# Patient Record
Sex: Female | Born: 1985 | Race: Black or African American | Hispanic: No | Marital: Single | Smoking: Current every day smoker
Health system: Southern US, Community
[De-identification: ages and names within clinical notes are randomized; demographics above are authoritative.]

## PROBLEM LIST (undated history)

## (undated) DIAGNOSIS — F419 Anxiety disorder, unspecified: Secondary | ICD-10-CM

## (undated) DIAGNOSIS — F329 Major depressive disorder, single episode, unspecified: Secondary | ICD-10-CM

## (undated) DIAGNOSIS — F32A Depression, unspecified: Secondary | ICD-10-CM

## (undated) DIAGNOSIS — I10 Essential (primary) hypertension: Secondary | ICD-10-CM

---
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
## 2006-08-21 IMAGING — US US TRANSVAGINAL NON-OB
1 series · 14 of 25 positions shown · non-contrast
Comparison: none

DUPLICATE COPY for exam association in [facility] - No change from original report (09/07/04).
CLINICAL DATA: Irregular menstrual bleeding. 
 TRANSABDOMINAL AND TRANSVAGINAL PELVIC ULTRASOUND: 
 No comparison.  The uterus is sonographically normal measuring 5.6cm long x 2cm AP x 3cm wide with 6mm fundal endometrial stripe thickness.  Slight cul-de-sac free fluid may represent ovulation or leaking/ruptured ovarian cyst currently not seen.  The bilateral ovaries are upper limits of normal in size with the right measuring 3.8cm long x 2.6cm AP x 2.8cm wide and the left 3.8cm long x 2.6cm AP x 2.8cm wide.  Peripheral sub-cm follicles are seen at the bilateral ovaries which could represent polycystic ovary disease ? need clinical correlation.

[Series 1: unknown · 0.16mm/px · 14 of 65 slices shown]
[im 1/65]
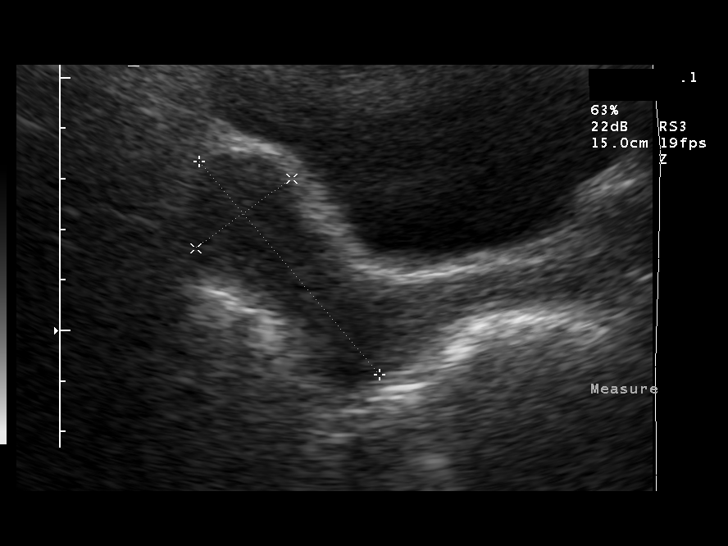
[im 6/65]
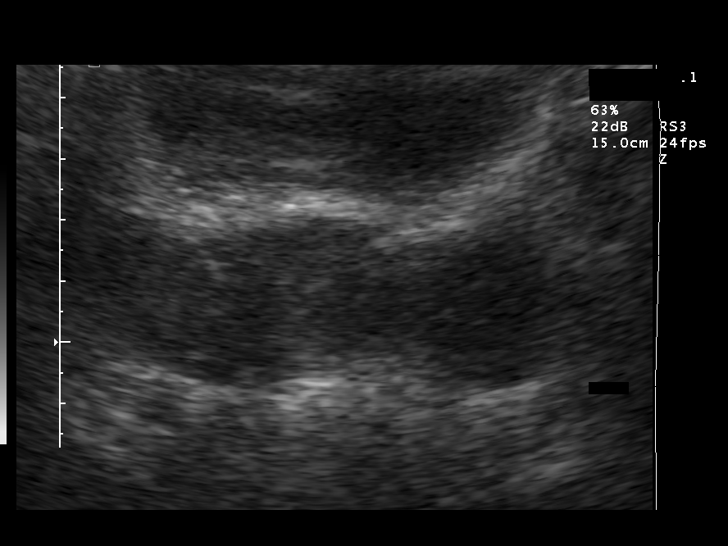
[im 11/65]
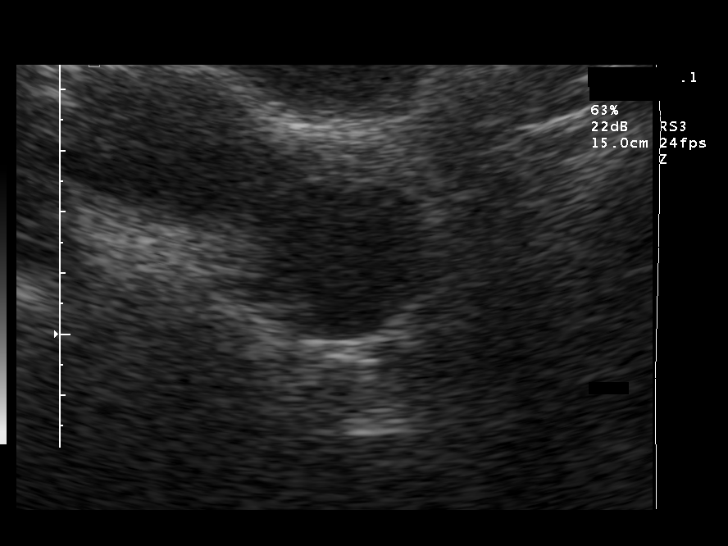
[im 17/65]
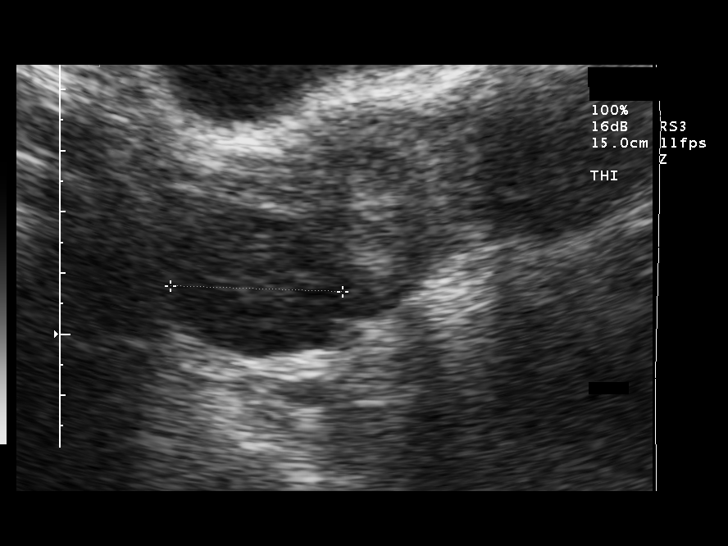
[im 22/65]
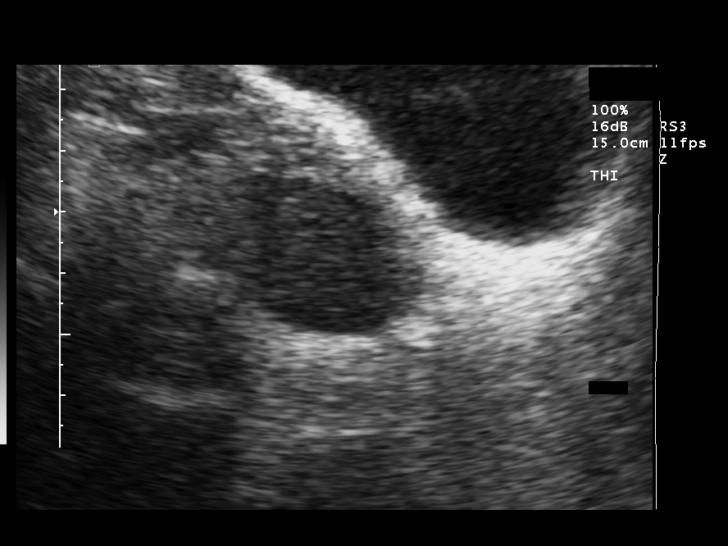
[im 25/65]
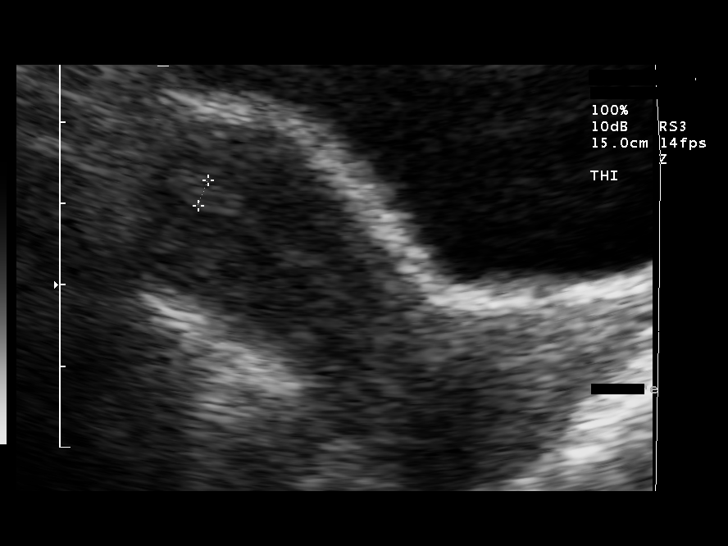
[im 30/65]
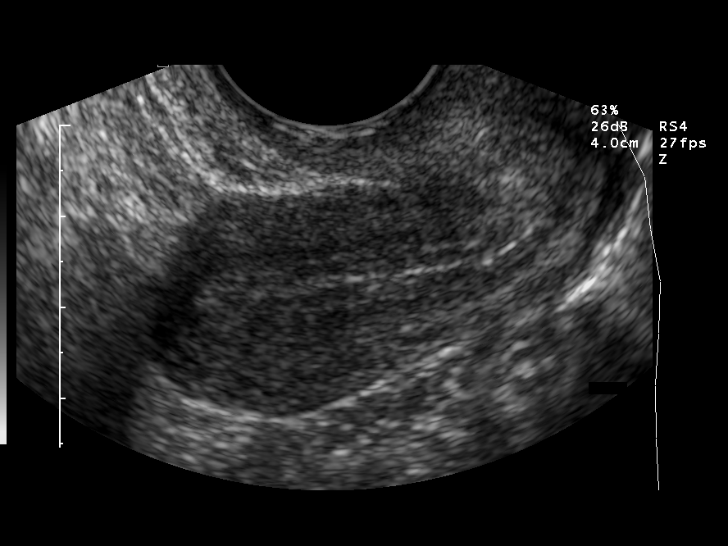
[im 35/65]
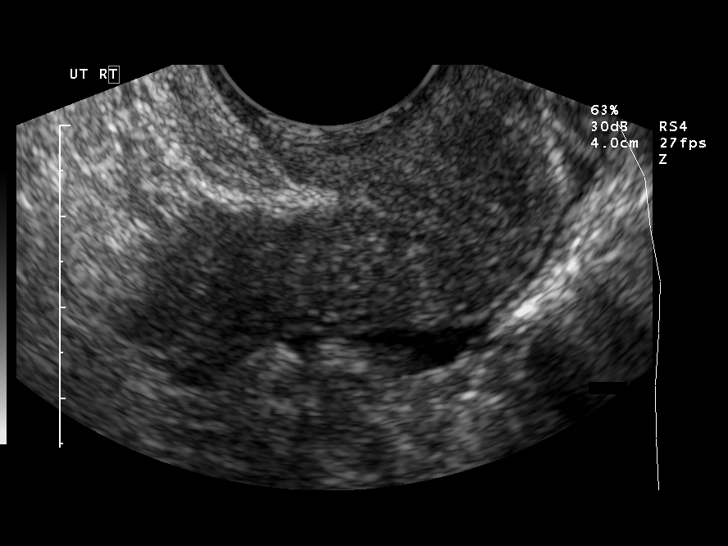
[im 41/65]
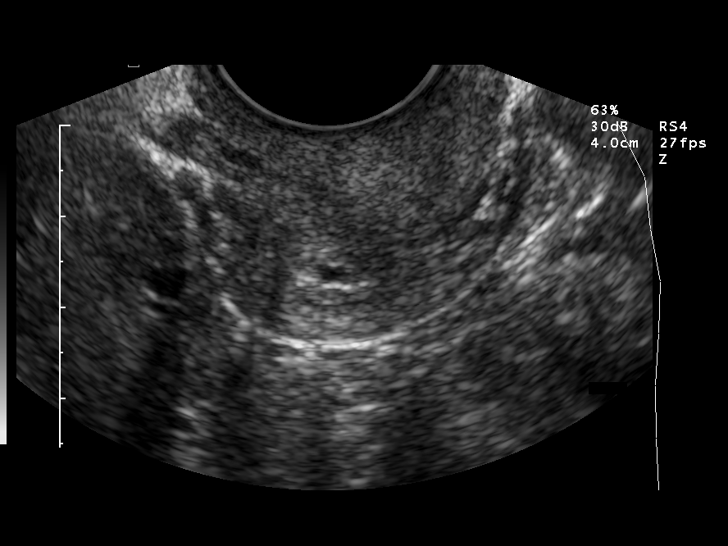
[im 43/65]
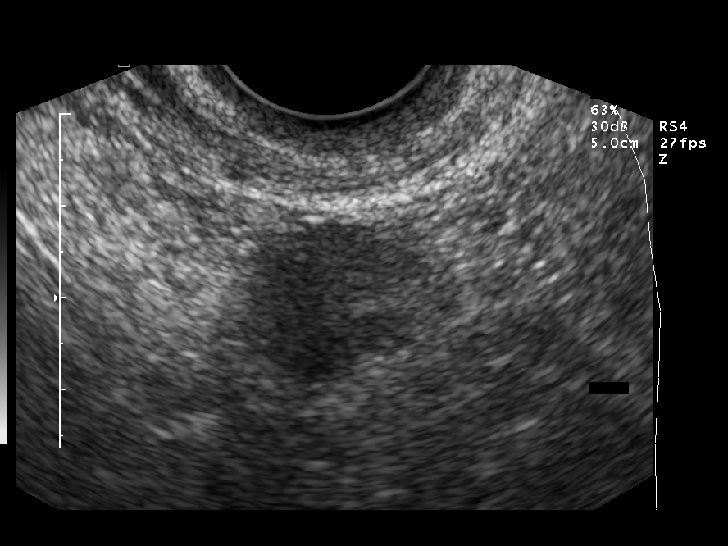
[im 49/65]
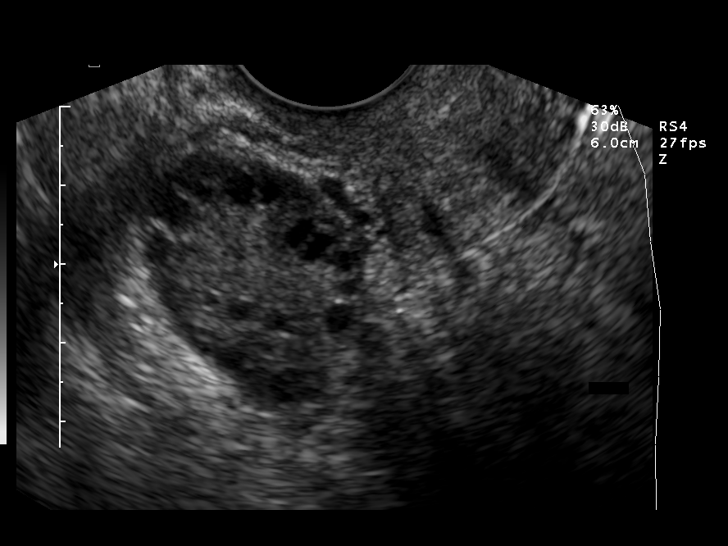
[im 54/65]
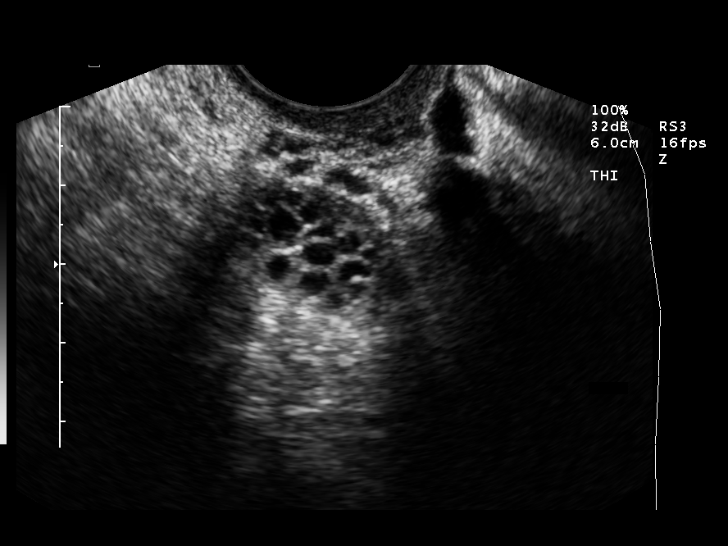
[im 59/65]
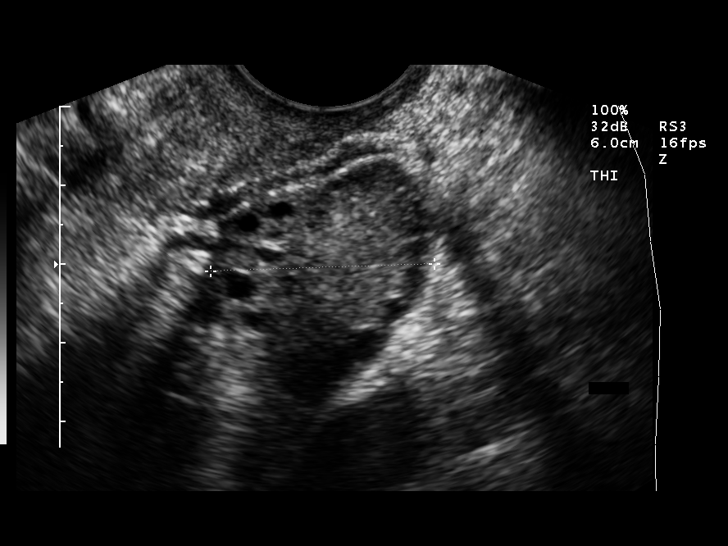
[im 65/65]
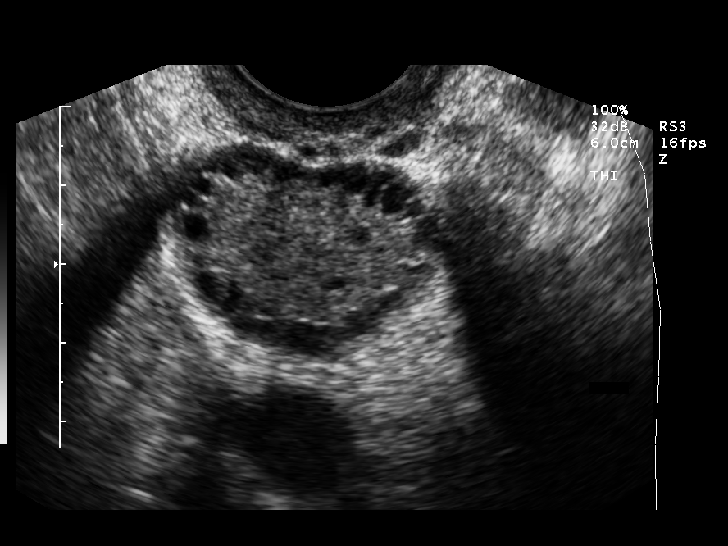

[14 of 25 positions shown; findings below may reference images not displayed]

IMPRESSION: 1.  Possible polycystic ovary disease. 
 2.  Slight cul-de-sac free fluid. 
 3.  Otherwise normal.

## 2014-09-13 ENCOUNTER — Emergency Department (HOSPITAL_COMMUNITY)
Admission: EM | Admit: 2014-09-13 | Discharge: 2014-09-13 | Disposition: A | Discharge status: Home / Self Care | Payer: Self-pay | Attending: Emergency Medicine | Admitting: Emergency Medicine

## 2014-09-13 ENCOUNTER — Encounter (HOSPITAL_COMMUNITY): Payer: Self-pay | Admitting: *Deleted

## 2014-09-13 DIAGNOSIS — F419 Anxiety disorder, unspecified: Secondary | ICD-10-CM | POA: Insufficient documentation

## 2014-09-13 DIAGNOSIS — R221 Localized swelling, mass and lump, neck: Secondary | ICD-10-CM | POA: Insufficient documentation

## 2014-09-13 DIAGNOSIS — K029 Dental caries, unspecified: Secondary | ICD-10-CM | POA: Insufficient documentation

## 2014-09-13 DIAGNOSIS — Z720 Tobacco use: Secondary | ICD-10-CM | POA: Insufficient documentation

## 2014-09-13 DIAGNOSIS — K088 Other specified disorders of teeth and supporting structures: Secondary | ICD-10-CM | POA: Insufficient documentation

## 2014-09-13 HISTORY — DX: Anxiety disorder, unspecified: F41.9

## 2014-09-13 HISTORY — DX: Essential (primary) hypertension: I10

## 2014-09-13 MED ORDER — NAPROXEN 500 MG PO TABS: 500.0000 mg | ORAL_TABLET | Freq: Two times a day (BID) | ORAL | Status: DC | PRN

## 2014-09-13 MED ORDER — HYDROCODONE-ACETAMINOPHEN 5-325 MG PO TABS: 1.0000 | ORAL_TABLET | Freq: Four times a day (QID) | ORAL | Status: DC | PRN

## 2014-09-13 MED ORDER — HYDROCODONE-ACETAMINOPHEN 5-325 MG PO TABS
1.0000 | ORAL_TABLET | Freq: Once | ORAL | Status: AC
  Administered 2014-09-13: 1 via ORAL
  Filled 2014-09-13: qty 1

## 2014-09-13 NOTE — Discharge Instructions (Signed)
Apply warm compresses to jaw throughout the day. Take naprosyn and norco as directed, as needed for pain but do not drive or operate machinery with pain medication use. Followup with a dentist is very important for ongoing evaluation and management of recurrent dental pain. Call the dentist listed above in the next 48 hours, or use the list below to find a dentist. STOP SMOKING. Return to emergency department for emergent changing or worsening symptoms.   Dental Caries Dental caries (also called tooth decay) is the most common oral disease. It can occur at any age but is more common in children and young adults.  HOW DENTAL CARIES DEVELOPS  The process of decay begins when bacteria and foods (particularly sugars and starches) combine in your mouth to produce plaque. Plaque is a substance that sticks to the hard, outer surface of a tooth (enamel). The bacteria in plaque produce acids that attack enamel. These acids may also attack the root surface of a tooth (cementum) if it is exposed. Repeated attacks dissolve these surfaces and create holes in the tooth (cavities). If left untreated, the acids destroy the other layers of the tooth.  RISK FACTORS  Frequent sipping of sugary beverages.   Frequent snacking on sugary and starchy foods, especially those that easily get stuck in the teeth.   Poor oral hygiene.   Dry mouth.   Substance abuse such as methamphetamine abuse.   Broken or poor-fitting dental restorations.   Eating disorders.   Gastroesophageal reflux disease (GERD).   Certain radiation treatments to the head and neck. SYMPTOMS In the early stages of dental caries, symptoms are seldom present. Sometimes white, chalky areas may be seen on the enamel or other tooth layers. In later stages, symptoms may include:  Pits and holes on the enamel.  Toothache after sweet, hot, or cold foods or drinks are consumed.  Pain around the tooth.  Swelling around the tooth. DIAGNOSIS    Most of the time, dental caries is detected during a regular dental checkup. A diagnosis is made after a thorough medical and dental history is taken and the surfaces of your teeth are checked for signs of dental caries. Sometimes special instruments, such as lasers, are used to check for dental caries. Dental X-ray exams may be taken so that areas not visible to the eye (such as between the contact areas of the teeth) can be checked for cavities.  TREATMENT  If dental caries is in its early stages, it may be reversed with a fluoride treatment or an application of a remineralizing agent at the dental office. Thorough brushing and flossing at home is needed to aid these treatments. If it is in its later stages, treatment depends on the location and extent of tooth destruction:   If a small area of the tooth has been destroyed, the destroyed area will be removed and cavities will be filled with a material such as gold, silver amalgam, or composite resin.   If a large area of the tooth has been destroyed, the destroyed area will be removed and a cap (crown) will be fitted over the remaining tooth structure.   If the center part of the tooth (pulp) is affected, a procedure called a root canal will be needed before a filling or crown can be placed.   If most of the tooth has been destroyed, the tooth may need to be pulled (extracted). HOME CARE INSTRUCTIONS You can prevent, stop, or reverse dental caries at home by practicing good oral  hygiene. Good oral hygiene includes:  Thoroughly cleaning your teeth at least twice a day with a toothbrush and dental floss.   Using a fluoride toothpaste. A fluoride mouth rinse may also be used if recommended by your dentist or health care provider.   Restricting the amount of sugary and starchy foods and sugary liquids you consume.   Avoiding frequent snacking on these foods and sipping of these liquids.   Keeping regular visits with a dentist for  checkups and cleanings. PREVENTION   Practice good oral hygiene.  Consider a dental sealant. A dental sealant is a coating material that is applied by your dentist to the pits and grooves of teeth. The sealant prevents food from being trapped in them. It may protect the teeth for several years.  Ask about fluoride supplements if you live in a community without fluorinated water or with water that has a low fluoride content. Use fluoride supplements as directed by your dentist or health care provider.  Allow fluoride varnish applications to teeth if directed by your dentist or health care provider. Document Released: 11/20/2001 Document Revised: 07/15/2013 Document Reviewed: 03/02/2012 [facility] Patient Information 2015 [place], [place]. This information is not intended to replace advice given to you by your health care provider. Make sure you discuss any questions you have with your health care provider.  Dental Pain Toothache is pain in or around a tooth. It may get worse with chewing or with cold or heat.  HOME CARE  Your dentist may use a numbing medicine during treatment. If so, you may need to avoid eating until the medicine wears off. Ask your dentist about this.  Only take medicine as told by your dentist or doctor.  Avoid chewing food near the painful tooth until after all treatment is done. Ask your dentist about this. GET HELP RIGHT AWAY IF:   The problem gets worse or new problems appear.  You have a fever.  There is redness and puffiness (swelling) of the face, jaw, or neck.  You cannot open your mouth.  There is pain in the jaw.  There is very bad pain that is not helped by medicine. MAKE SURE YOU:   Understand these instructions.  Will watch your condition.  Will get help right away if you are not doing well or get worse. Document Released: 08/17/2007 Document Revised: 05/23/2011 Document Reviewed: 08/17/2007 [facility] Patient Information 2015 [place], [place].  This information is not intended to replace advice given to you by your health care provider. Make sure you discuss any questions you have with your health care provider.  Smoking Cessation, Tips for Success If you are ready to quit smoking, congratulations! You have chosen to help yourself be healthier. Cigarettes bring nicotine, tar, carbon monoxide, and other irritants into your body. Your lungs, heart, and blood vessels will be able to work better without these poisons. There are many different ways to quit smoking. Nicotine gum, nicotine patches, a nicotine inhaler, or nicotine nasal spray can help with physical craving. Hypnosis, support groups, and medicines help break the habit of smoking. WHAT THINGS CAN I DO TO MAKE QUITTING EASIER?  Here are some tips to help you quit for good:  Pick a date when you will quit smoking completely. Tell all of your friends and family about your plan to quit on that date.  Do not try to slowly cut down on the number of cigarettes you are smoking. Pick a quit date and quit smoking completely starting on that day.  Throw away all cigarettes.   Clean and remove all ashtrays from your home, work, and car.  On a card, write down your reasons for quitting. Carry the card with you and read it when you get the urge to smoke.  Cleanse your body of nicotine. Drink enough water and fluids to keep your urine clear or pale yellow. Do this after quitting to flush the nicotine from your body.  Learn to predict your moods. Do not let a bad situation be your excuse to have a cigarette. Some situations in your life might tempt you into wanting a cigarette.  Never have "just one" cigarette. It leads to wanting another and another. Remind yourself of your decision to quit.  Change habits associated with smoking. If you smoked while driving or when feeling stressed, try other activities to replace smoking. Stand up when drinking your coffee. Brush your teeth after eating.  Sit in a different chair when you read the paper. Avoid alcohol while trying to quit, and try to drink fewer caffeinated beverages. Alcohol and caffeine may urge you to smoke.  Avoid foods and drinks that can trigger a desire to smoke, such as sugary or spicy foods and alcohol.  Ask people who smoke not to smoke around you.  Have something planned to do right after eating or having a cup of coffee. For example, plan to take a walk or exercise.  Try a relaxation exercise to calm you down and decrease your stress. Remember, you may be tense and nervous for the first 2 weeks after you quit, but this will pass.  Find new activities to keep your hands busy. Play with a pen, coin, or rubber band. Doodle or draw things on paper.  Brush your teeth right after eating. This will help cut down on the craving for the taste of tobacco after meals. You can also try mouthwash.   Use oral substitutes in place of cigarettes. Try using lemon drops, carrots, cinnamon sticks, or chewing gum. Keep them handy so they are available when you have the urge to smoke.  When you have the urge to smoke, try deep breathing.  Designate your home as a nonsmoking area.  If you are a heavy smoker, ask your health care provider about a prescription for nicotine chewing gum. It can ease your withdrawal from nicotine.  Reward yourself. Set aside the cigarette money you save and buy yourself something nice.  Look for support from others. Join a support group or smoking cessation program. Ask someone at home or at work to help you with your plan to quit smoking.  Always ask yourself, "Do I need this cigarette or is this just a reflex?" Tell yourself, "Today, I choose not to smoke," or "I do not want to smoke." You are reminding yourself of your decision to quit.  Do not replace cigarette smoking with electronic cigarettes (commonly called e-cigarettes). The safety of e-cigarettes is unknown, and some may contain harmful  chemicals.  If you relapse, do not give up! Plan ahead and think about what you will do the next time you get the urge to smoke. HOW WILL I FEEL WHEN I QUIT SMOKING? You may have symptoms of withdrawal because your body is used to nicotine (the addictive substance in cigarettes). You may crave cigarettes, be irritable, feel very hungry, cough often, get headaches, or have difficulty concentrating. The withdrawal symptoms are only temporary. They are strongest when you first quit but will go away within 10-14 days. When  withdrawal symptoms occur, stay in control. Think about your reasons for quitting. Remind yourself that these are signs that your body is healing and getting used to being without cigarettes. Remember that withdrawal symptoms are easier to treat than the major diseases that smoking can cause.  Even after the withdrawal is over, expect periodic urges to smoke. However, these cravings are generally short lived and will go away whether you smoke or not. Do not smoke! WHAT RESOURCES ARE AVAILABLE TO HELP ME QUIT SMOKING? Your health care provider can direct you to community resources or hospitals for support, which may include:  Group support.  Education.  Hypnosis.  Therapy. Document Released: 11/27/2003 Document Revised: 07/15/2013 Document Reviewed: 08/16/2012 [facility] Patient Information 2015 [place], [place]. This information is not intended to replace advice given to you by your health care provider. Make sure you discuss any questions you have with your health care provider.   Emergency Department Resource Guide 1) Find a Doctor and Pay Out of Pocket Although you won't have to find out who is covered by your insurance plan, it is a good idea to ask around and get recommendations. You will then need to call the office and see if the doctor you have chosen will accept you as a new patient and what types of options they offer for patients who are self-pay. Some doctors offer  discounts or will set up payment plans for their patients who do not have insurance, but you will need to ask so you aren't surprised when you get to your appointment.  2) Contact Your Local Health Department Not all health departments have doctors that can see patients for sick visits, but many do, so it is worth a call to see if yours does. If you don't know where your local health department is, you can check in your phone book. The CDC also has a tool to help you locate your state's health department, and many state websites also have listings of all of their local health departments.  3) Find a Walk-in Clinic If your illness is not likely to be very severe or complicated, you may want to try a walk in clinic. These are popping up all over the country in pharmacies, drugstores, and shopping centers. They're usually staffed by nurse practitioners or physician assistants that have been trained to treat common illnesses and complaints. They're usually fairly quick and inexpensive. However, if you have serious medical issues or chronic medical problems, these are probably not your best option.  No Primary Care Doctor: - Call Health Connect at  [phone] - they can help you locate a primary care doctor that  accepts your insurance, provides certain services, etc. - Physician Referral Service- [phone]  Chronic Pain Problems: Organization         Address  Phone   Notes  [facility]  [phone] Patients need to be referred by their primary care doctor.   Medication Assistance: Organization         Address  Phone   Notes  [facility] Medication [facility] [address] [place], [place] [address] [phone] --Must be a resident of [facility] -- Must have NO insurance coverage whatsoever (no Medicaid/ Medicare, etc.) -- The pt. MUST have a primary care doctor that directs their care regularly and follows them in the community   [facility]   [phone]   [facility]  [phone]     Dental Care: Organization  Address  Phone  Notes  [facility] Department of [facility] [address] [place], [place] [phone] Accepts children up to age 67 who are enrolled in [place] or [place] Health Choice; pregnant women with a Medicaid card; and children who have applied for Medicaid or [place] Health Choice, but were declined, whose parents can pay a reduced fee at time of service.  [facility] Department of [facility]  [address], [place] [phone] Accepts children up to age 24 who are enrolled in [place] or [place] Health Choice; pregnant women with a Medicaid card; and children who have applied for Medicaid or [place] Health Choice, but were declined, whose parents can pay a reduced fee at time of service.  [facility]  [address] [place], [place] [phone] Patients are seen by appointment only. Walk-ins are not accepted. [facility] will see patients 75 years of age and older. Monday - Tuesday (8am-5pm) Most Wednesdays (8:30-5pm) $30 per visit, cash only  [facility]  [address], [facility] [phone] Patients are seen by appointment only. Walk-ins are not accepted. [facility] will see patients 34 years of age and older. One Wednesday Evening (Monthly: Volunteer Based).  $30 per visit, cash only  [employer] [facility] [employer]  [phone] for adults; Children under age 79, call Graduate Pediatric Dentistry at [phone]. Children aged 71-14, please call [phone] to request a pediatric application.  Dental services are provided in all areas of dental care including fillings, crowns and bridges, complete and partial dentures, implants, gum treatment, root canals, and extractions. Preventive care is also provided. Treatment is provided to both adults and children. Patients are selected via  a lottery and there is often a waiting list.   [facility] [address] [place]  [phone] [email or web address]   [facility] [address] [place], [place] [phone], Ext. [phone] Second and Fourth Thursday of each month, opens at 6:30 AM; Clinic ends at 9 AM.  Patients are seen on a first-come first-served basis, and a limited number are seen during each clinic.   [facility]  [address] [place], [place] [phone]   Eligibility Requirements You must have lived in [place], [place], or [place] counties for at least the last three months.   You cannot be eligible for state or federal sponsored [place], including [employer], [place], or [employer].   You generally cannot be eligible for healthcare insurance through your employer.    How to apply: Eligibility screenings are held every Tuesday and Wednesday afternoon from 1:00 pm until 4:00 pm. You do not need an appointment for the interview!  [facility] [address] [place], [place] [phone]   [facility]  [phone]   [facility]  [phone]   [facility] Health Department  [phone]

## 2014-09-13 NOTE — ED Notes (Signed)
Pt reports ongoint dental pain and pain increased  Today pain is 9/10

## 2014-09-13 NOTE — ED Provider Notes (Signed)
CSN: [id number]     Arrival date & time 09/13/14  0918 History  This chart was scribed for [employer], PA-C, working with Vanetta Mulders, MD by Chestine Spore, ED Scribe. The patient was seen in room TR08C/TR08C at 9:34 AM.    Chief Complaint  Patient presents with  . Dental Problem      Patient is a 29 y.o. female presenting with tooth pain. The history is provided by the patient. No language interpreter was used.  Dental Pain Location:  Lower Lower teeth location:  17/LL 3rd molar Quality:  Throbbing Severity:  Moderate Onset quality:  Gradual Duration:  2 months Timing:  Constant Progression:  Worsening Chronicity:  Recurrent Context: dental caries and poor dentition   Previous work-up:  Dental exam Relieved by:  Nothing Exacerbated by: air. Ineffective treatments:  NSAIDs Associated symptoms: neck swelling (subjective)   Associated symptoms: no difficulty swallowing, no drooling, no facial swelling, no fever, no gum swelling, no neck pain, no oral bleeding, no oral lesions and no trismus   Risk factors: lack of dental care and smoking   Risk factors: no diabetes     BUNNIE REHBERG is a 29 y.o. female with no significant PMHx who presents to the Emergency department complaining of left lower back dental pain onset 2 months worsening 1 week ago. Pt pain is 9/10, constant, throbbing, and it radiates down left side of neck. She states that air worsens her pain. She reports that she has tried aleve with no relief for her symptoms. Pt has just moved to the area and is in the process of acquiring insurance. She reports that when she last had dental work completed, her dentist reported that she needs to have a root canal. She states that she is having associated symptoms of subjective left neck swelling. She denies gum swelling/bleeding/drainage, fever, chills, neck stiffness, trismus, drooling, sore throat, rhinorrhea, CP, abdominal pain, n/v, numbness, tingling, weakness,  and any other symptoms. Pt denies being allergic to any medications. Pt reports that she is a current smoker. Pt denies being allergic to any medications.     No past medical history on file. No past surgical history on file. No family history on file. History  Substance Use Topics  . Smoking status: Not on file  . Smokeless tobacco: Not on file  . Alcohol Use: Not on file   OB History    No data available     Review of Systems  Constitutional: Negative for fever and chills.  HENT: Positive for dental problem. Negative for drooling, facial swelling, mouth sores, rhinorrhea, sore throat and trouble swallowing.   Respiratory: Negative for cough and shortness of breath.   Cardiovascular: Negative for chest pain.  Gastrointestinal: Negative for nausea, vomiting and abdominal pain.  Musculoskeletal: Negative for neck pain and neck stiffness.  Skin: Negative for color change.  Neurological: Negative for weakness and numbness.    A complete 10 system review of systems was obtained and all systems are negative except as noted in the HPI and PMH.    Allergies  Review of patient's allergies indicates not on file.  Home Medications   Prior to Admission medications   Medication Sig Start Date End Date Taking? Authorizing Provider  HYDROcodone-acetaminophen (NORCO) 5-325 MG per tablet Take 1 tablet by mouth every 6 (six) hours as needed for severe pain. 09/13/14   Axie Hayne Camprubi-Soms, PA-C  naproxen (NAPROSYN) 500 MG tablet Take 1 tablet (500 mg total) by mouth 2 (two) times daily  as needed for mild pain, moderate pain or headache (TAKE WITH MEALS.). 09/13/14   Desmen Schoffstall Camprubi-Soms, PA-C   BP 153/107 mmHg  Pulse 81  Temp(Src) 97.8 F (36.6 C)  Resp 16  Ht 5\' 7"  (1.702 m)  Wt 242 lb (109.77 kg)  BMI 37.89 kg/m2  SpO2 99% Physical Exam  Constitutional: She is oriented to person, place, and time. Vital signs are normal. She appears well-developed and well-nourished.  Non-toxic  appearance. No distress.  Afebrile, nontoxic, NAD  HENT:  Head: Normocephalic and atraumatic.  Nose: Nose normal.  Mouth/Throat: Uvula is midline, oropharynx is clear and moist and mucous membranes are normal. No trismus in the jaw. Dental caries present. No dental abscesses or uvula swelling.  Left lower molar number 17 decayed without surrounding gingival erythema or swelling, no evidence of abscess. Nose clear. Oropharynx clear and moist, without uvular swelling or deviation, no trismus or drooling, no tonsillar swelling or erythema, no exudates.   Eyes: Conjunctivae and EOM are normal. Right eye exhibits no discharge. Left eye exhibits no discharge.  Neck: Normal range of motion. Neck supple.  Cardiovascular: Normal rate.   Pulmonary/Chest: Effort normal. No respiratory distress.  Abdominal: Normal appearance. She exhibits no distension.  Musculoskeletal: Normal range of motion.  Neurological: She is alert and oriented to person, place, and time. She has normal strength. No sensory deficit.  Skin: Skin is warm, dry and intact. No rash noted.  Psychiatric: She has a normal mood and affect. Her behavior is normal.  Nursing note and vitals reviewed.   ED Course  Procedures (including critical care time) COORDINATION OF CARE: 9:41 AM-Discussed treatment plan which includes referral to dentist, warm compress, and norco with pt at bedside and pt agreed to plan.   Labs Review Labs Reviewed - No data to display  Imaging Review No results found.   EKG Interpretation None      MDM   Final diagnoses:  Dental caries  Pain due to dental caries  Tobacco abuse    29 y.o. female here with Dental pain associated with dental caries without signs of infection, ongoing for months, with patient afebrile, non toxic appearing and swallowing secretions well. I gave patient referral to dentist and stressed the importance of dental follow up for ultimate management of dental pain.  I have also  discussed reasons to return immediately to the ER.  Patient expresses understanding and agrees with plan.  I will also give pain control.  Doubt need for abx. Smoking cessation discussed.  I personally performed the services described in this documentation, which was scribed in my presence. The recorded information has been reviewed and is accurate.  BP 153/107 mmHg  Pulse 81  Temp(Src) 97.8 F (36.6 C)  Resp 16  Ht 5\' 7"  (1.702 m)  Wt 242 lb (109.77 kg)  BMI 37.89 kg/m2  SpO2 99%  LMP 08/14/2014  Meds ordered this encounter  Medications  . HYDROcodone-acetaminophen (NORCO/VICODIN) 5-325 MG per tablet 1 tablet    Sig:   . HYDROcodone-acetaminophen (NORCO) 5-325 MG per tablet    Sig: Take 1 tablet by mouth every 6 (six) hours as needed for severe pain.    Dispense:  6 tablet    Refill:  0    Order Specific Question:  Supervising Provider    Answer:  MILLER, BRIAN [[id number]]  . naproxen (NAPROSYN) 500 MG tablet    Sig: Take 1 tablet (500 mg total) by mouth 2 (two) times daily as needed for mild pain,  moderate pain or headache (TAKE WITH MEALS.).    Dispense:  20 tablet    Refill:  0    Order Specific Question:  Supervising Provider    Answer:  Eber HongMILLER, BRIAN [[id number]]     Deborh Pense Camprubi-Soms, PA-C 09/13/14 [phone]  Vanetta MuldersScott Zackowski, MD 09/18/14 [phone]

## 2014-09-13 NOTE — ED Notes (Signed)
Declined W/C at D/C and was escorted to lobby by RN. 

## 2014-09-18 ENCOUNTER — Emergency Department (HOSPITAL_COMMUNITY): Payer: Self-pay

## 2014-09-18 ENCOUNTER — Encounter (HOSPITAL_COMMUNITY): Payer: Self-pay | Admitting: Emergency Medicine

## 2014-09-18 ENCOUNTER — Emergency Department (HOSPITAL_COMMUNITY)
Admission: EM | Admit: 2014-09-18 | Discharge: 2014-09-18 | Disposition: A | Discharge status: Home / Self Care | Payer: Self-pay | Attending: Emergency Medicine | Admitting: Emergency Medicine

## 2014-09-18 DIAGNOSIS — F329 Major depressive disorder, single episode, unspecified: Secondary | ICD-10-CM | POA: Insufficient documentation

## 2014-09-18 DIAGNOSIS — I10 Essential (primary) hypertension: Secondary | ICD-10-CM | POA: Insufficient documentation

## 2014-09-18 DIAGNOSIS — R109 Unspecified abdominal pain: Secondary | ICD-10-CM

## 2014-09-18 DIAGNOSIS — Z720 Tobacco use: Secondary | ICD-10-CM | POA: Insufficient documentation

## 2014-09-18 DIAGNOSIS — F419 Anxiety disorder, unspecified: Secondary | ICD-10-CM | POA: Insufficient documentation

## 2014-09-18 DIAGNOSIS — F32A Depression, unspecified: Secondary | ICD-10-CM

## 2014-09-18 DIAGNOSIS — Z79899 Other long term (current) drug therapy: Secondary | ICD-10-CM | POA: Insufficient documentation

## 2014-09-18 DIAGNOSIS — R1011 Right upper quadrant pain: Secondary | ICD-10-CM | POA: Insufficient documentation

## 2014-09-18 DIAGNOSIS — R1013 Epigastric pain: Secondary | ICD-10-CM | POA: Insufficient documentation

## 2014-09-18 DIAGNOSIS — R110 Nausea: Secondary | ICD-10-CM | POA: Insufficient documentation

## 2014-09-18 DIAGNOSIS — Z3202 Encounter for pregnancy test, result negative: Secondary | ICD-10-CM | POA: Insufficient documentation

## 2014-09-18 DIAGNOSIS — Z8719 Personal history of other diseases of the digestive system: Secondary | ICD-10-CM | POA: Insufficient documentation

## 2014-09-18 HISTORY — DX: Major depressive disorder, single episode, unspecified: F32.9

## 2014-09-18 HISTORY — DX: Depression, unspecified: F32.A

## 2014-09-18 LAB — COMPREHENSIVE METABOLIC PANEL
ALT: 21 U/L (ref 14–54)
ANION GAP: 6 (ref 5–15)
AST: 23 U/L (ref 15–41)
Albumin: 4 g/dL (ref 3.5–5.0)
Alkaline Phosphatase: 55 U/L (ref 38–126)
BUN: 10 mg/dL (ref 6–20)
CALCIUM: 9.2 mg/dL (ref 8.9–10.3)
CO2: 28 mmol/L (ref 22–32)
CREATININE: 0.79 mg/dL (ref 0.44–1.00)
Chloride: 105 mmol/L (ref 101–111)
GFR calc non Af Amer: >60 mL/min (ref >60)
GLUCOSE: 121 mg/dL — AB (ref 65–99)
Potassium: 3.8 mmol/L (ref 3.5–5.1)
SODIUM: 139 mmol/L (ref 135–145)
TOTAL PROTEIN: 7.4 g/dL (ref 6.5–8.1)
Total Bilirubin: 0.7 mg/dL (ref 0.3–1.2)

## 2014-09-18 LAB — URINALYSIS, ROUTINE W REFLEX MICROSCOPIC
Glucose, UA: NEGATIVE mg/dL
KETONES UR: 15 mg/dL — AB
LEUKOCYTES UA: NEGATIVE
NITRITE: NEGATIVE
Protein, ur: NEGATIVE mg/dL
Specific Gravity, Urine: 1.034 — ABNORMAL HIGH (ref 1.005–1.030)
Urobilinogen, UA: 0.2 mg/dL (ref 0.0–1.0)
pH: 5.5 (ref 5.0–8.0)

## 2014-09-18 LAB — URINE MICROSCOPIC-ADD ON

## 2014-09-18 LAB — CBC WITH DIFFERENTIAL/PLATELET
Basophils Absolute: 0 10*3/uL (ref 0.0–0.1)
Basophils Relative: 0 % (ref 0–1)
EOS ABS: 0.2 10*3/uL (ref 0.0–0.7)
Eosinophils Relative: 3 % (ref 0–5)
HCT: 42.1 % (ref 36.0–46.0)
Hemoglobin: 14.8 g/dL (ref 12.0–15.0)
LYMPHS ABS: 2.6 10*3/uL (ref 0.7–4.0)
LYMPHS PCT: 36 % (ref 12–46)
MCH: 33.3 pg (ref 26.0–34.0)
MCHC: 35.2 g/dL (ref 30.0–36.0)
MCV: 94.8 fL (ref 78.0–100.0)
MONOS PCT: 5 % (ref 3–12)
Monocytes Absolute: 0.4 10*3/uL (ref 0.1–1.0)
NEUTROS PCT: 56 % (ref 43–77)
Neutro Abs: 4 10*3/uL (ref 1.7–7.7)
PLATELETS: 254 10*3/uL (ref 150–400)
RBC: 4.44 MIL/uL (ref 3.87–5.11)
RDW: 12.4 % (ref 11.5–15.5)
WBC: 7.2 10*3/uL (ref 4.0–10.5)

## 2014-09-18 LAB — LIPASE, BLOOD: Lipase: 24 U/L (ref 22–51)

## 2014-09-18 LAB — POC URINE PREG, ED: PREG TEST UR: NEGATIVE

## 2014-09-18 MED ORDER — ACETAMINOPHEN 325 MG PO TABS
650.0000 mg | ORAL_TABLET | Freq: Once | ORAL | Status: AC
  Administered 2014-09-18: 650 mg via ORAL
  Filled 2014-09-18: qty 2

## 2014-09-18 MED ORDER — FAMOTIDINE 20 MG PO TABS: 20.0000 mg | ORAL_TABLET | Freq: Two times a day (BID) | ORAL | Status: DC

## 2014-09-18 MED ORDER — SUCRALFATE 1 G PO TABS: 1.0000 g | ORAL_TABLET | Freq: Three times a day (TID) | ORAL | Status: DC

## 2014-09-18 MED ORDER — FLUOXETINE HCL 20 MG PO TABS: 20.0000 mg | ORAL_TABLET | Freq: Every day | ORAL | Status: DC

## 2014-09-18 NOTE — ED Provider Notes (Signed)
CSN: [id number]     Arrival date & time 09/18/14  1626 History  This chart was scribed for non-physician provider Jaynie Crumble, PA-C, working with Toy Cookey, MD by Phillis Haggis, ED Scribe. This patient was seen in room TR11C/TR11C and patient care was started at 6:34 PM.     Chief Complaint  Patient presents with  . Abdominal Pain   The history is provided by the patient. No language interpreter was used.   HPI Comments: Laurie Holmes is a 29 y.o. female who presents to the Emergency Department complaining of waxing and waning burning mid abdominal pain onset 14 hours ago. She states that the pain worsens after eating and started at 4 AM. She reports associated nausea and frequency. She denies vomiting, dysuria, diarrhea, history of abdominal surgery, history of gallbladder problems, or history of similar symptoms. Pt requests refills for her 10 mg Prozac. States that she just moved to the area 3 weeks ago and has not been able to get insurance or found a doctor to refill it; has not had her medication in one week.   Past Medical History  Diagnosis Date  . Hypertension   . Anxiety   . Depression    History reviewed. No pertinent past surgical history. No family history on file. History  Substance Use Topics  . Smoking status: Current Every Day Smoker    Types: Cigarettes  . Smokeless tobacco: Never Used  . Alcohol Use: Yes     Comment: rare   OB History    No data available     Review of Systems  Gastrointestinal: Positive for nausea and abdominal pain. Negative for vomiting and diarrhea.  Genitourinary: Positive for frequency. Negative for dysuria.   Allergies  Review of patient's allergies indicates no known allergies.  Home Medications   Prior to Admission medications   Medication Sig Start Date End Date Taking? Authorizing Provider  FLUoxetine (PROZAC) 20 MG tablet Take 20 mg by mouth daily.   Yes Historical Provider, MD  HYDROcodone-acetaminophen  (NORCO) 5-325 MG per tablet Take 1 tablet by mouth every 6 (six) hours as needed for severe pain. 09/13/14  Yes Mercedes Camprubi-Soms, PA-C  naproxen (NAPROSYN) 500 MG tablet Take 1 tablet (500 mg total) by mouth 2 (two) times daily as needed for mild pain, moderate pain or headache (TAKE WITH MEALS.). 09/13/14  Yes Mercedes Camprubi-Soms, PA-C   BP 147/92 mmHg  Pulse 75  Temp(Src) 98.3 F (36.8 C) (Oral)  Resp 18  SpO2 99%  LMP 08/14/2014   Physical Exam  Constitutional: She is oriented to person, place, and time. She appears well-developed and well-nourished.  HENT:  Head: Normocephalic and atraumatic.  Eyes: EOM are normal.  Neck: Normal range of motion. Neck supple.  Cardiovascular: Normal rate, regular rhythm and normal heart sounds.   Pulmonary/Chest: Effort normal and breath sounds normal. No respiratory distress. She has no wheezes. She has no rales.  Abdominal: Soft. Bowel sounds are normal. She exhibits no distension. There is tenderness. There is no rebound and no guarding.  Epigastric and right upper quadrant tenderness  Musculoskeletal: Normal range of motion.  Neurological: She is alert and oriented to person, place, and time.  Skin: Skin is warm and dry.  Psychiatric: She has a normal mood and affect. Her behavior is normal.  Nursing note and vitals reviewed.   ED Course  Procedures (including critical care time) DIAGNOSTIC STUDIES: Oxygen Saturation is 99% on RA, normal by my interpretation.    COORDINATION  OF CARE: 6:38 PM-Discussed treatment plan which includes refill of Prozac, ultrasound and labs with pt at bedside and pt agreed to plan.   Labs Review Labs Reviewed  COMPREHENSIVE METABOLIC PANEL - Abnormal; Notable for the following:    Glucose, Bld 121 (*)    All other components within normal limits  URINALYSIS, ROUTINE W REFLEX MICROSCOPIC (NOT [facility]) - Abnormal; Notable for the following:    Color, Urine AMBER (*)    APPearance TURBID (*)    Specific  Gravity, Urine 1.034 (*)    Hgb urine dipstick LARGE (*)    Bilirubin Urine SMALL (*)    Ketones, ur 15 (*)    All other components within normal limits  URINE MICROSCOPIC-ADD ON - Abnormal; Notable for the following:    Squamous Epithelial / LPF MANY (*)    Bacteria, UA FEW (*)    All other components within normal limits  CBC WITH DIFFERENTIAL/PLATELET  LIPASE, BLOOD  POC URINE PREG, ED    Imaging Review [place] Abdomen Complete  09/18/2014   CLINICAL DATA:  Acute onset of generalized abdominal pain for 1 day. Initial encounter.  EXAM: ULTRASOUND ABDOMEN COMPLETE  COMPARISON:  None.  FINDINGS: Gallbladder: No gallstones or wall thickening visualized. No sonographic Murphy sign noted.  Common bile duct: Diameter: 0.3 cm, within normal limits in caliber.  Liver: No focal lesion identified. Within normal limits in parenchymal echogenicity.  IVC: No abnormality visualized.  Pancreas: Visualized portion unremarkable.  Spleen: Size and appearance within normal limits.  Right Kidney: Length: 12.2 cm. Echogenicity within normal limits. No mass or hydronephrosis visualized.  Left Kidney: Length: 12.0 cm. Echogenicity within normal limits. A mild fetal lobulation is noted. No mass or hydronephrosis visualized.  Abdominal aorta: No aneurysm visualized.  Other findings: None.  IMPRESSION: Unremarkable abdominal ultrasound.   Electronically Signed   By: Roanna RaiderJeffery  Chang M.D.   On: 09/18/2014 21:48     EKG Interpretation None      MDM   Final diagnoses:  Abdominal pain, unspecified abdominal location  Depression   patient emergency department with 2 separate complaints. Patient recently moved here and requests refill of her Prozac until she is able to follow up. Patient is also complaining of epigastric abdominal pain that is worse after eating. On exam tenderness epigastric area and right upper quadrant. Will get ultrasound for evaluation of the gallbladder.  Ultrasound is negative. Labs unremarkable.  Most likely GERD. Vital signs are normal except for mild hypertension. Plan to discharge home, started on Carafate and Pepcid. Also will give her a one-month prescription of Prozac. Will give her a resource guide for outpatient follow-up. Return precautions discussed.  Filed Vitals:   09/18/14 1704 09/18/14 2219  BP: 147/92 153/98  Pulse: 75 65  Temp: 98.3 F (36.8 C) 98.2 F (36.8 C)  TempSrc: Oral Oral  Resp: 18 16  SpO2: 99% 100%    I personally performed the services described in this documentation, which was scribed in my presence. The recorded information has been reviewed and is accurate.    Jaynie Crumbleatyana Mali Eppard, PA-C 09/19/14 [phone]  Derwood KaplanAnkit Nanavati, MD 09/20/14 [phone]

## 2014-09-18 NOTE — ED Notes (Signed)
Patient returned from US.

## 2014-09-18 NOTE — ED Notes (Signed)
Pt c/o mid abd pain, describes as burning.  Nausea with no vomiting. Denies diarrhea.  Pt also st's she just moved here and she has been out of her Prozac for past week and needs Rx for same

## 2014-09-18 NOTE — Discharge Instructions (Signed)
pepcid daily for epigastric pain. carafate as needed. Continue your prozac. Please follow up with primary care doctor if not improving.   Emergency Department Resource Guide 1) Find a Doctor and Pay Out of Pocket Although you won't have to find out who is covered by your insurance plan, it is a good idea to ask around and get recommendations. You will then need to call the office and see if the doctor you have chosen will accept you as a new patient and what types of options they offer for patients who are self-pay. Some doctors offer discounts or will set up payment plans for their patients who do not have insurance, but you will need to ask so you aren't surprised when you get to your appointment.  2) Contact Your Local Health Department Not all health departments have doctors that can see patients for sick visits, but many do, so it is worth a call to see if yours does. If you don't know where your local health department is, you can check in your phone book. The CDC also has a tool to help you locate your state's health department, and many state websites also have listings of all of their local health departments.  3) Find a Walk-in Clinic If your illness is not likely to be very severe or complicated, you may want to try a walk in clinic. These are popping up all over the country in pharmacies, drugstores, and shopping centers. They're usually staffed by nurse practitioners or physician assistants that have been trained to treat common illnesses and complaints. They're usually fairly quick and inexpensive. However, if you have serious medical issues or chronic medical problems, these are probably not your best option.  No Primary Care Doctor: - Call Health Connect at  [phone] - they can help you locate a primary care doctor that  accepts your insurance, provides certain services, etc. - Physician Referral Service- [phone]  Chronic Pain Problems: Organization         Address  Phone    Notes  [facility]  [phone] Patients need to be referred by their primary care doctor.   Medication Assistance: Organization         Address  Phone   Notes  [facility] [address] [place], [place] [id number] [phone] --Must be a resident of [facility] [place] -- Must have NO insurance coverage whatsoever (no Medicaid/ Medicare, etc.) -- The pt. MUST have a primary care doctor that directs their care regularly and follows them in the community   MedAssist  [phone]   [employer]  [phone]    Agencies that provide inexpensive medical care: Organization         Address  Phone   Notes  [facility]  [phone]   [facility]    [phone]   [facility] [address] [place], [place] [address] [phone]   [facility] [address], [place] [phone]   Planned Parenthood    [phone]   [facility]    [phone]   [facility]  [address], [place] Phone:  [phone], Fax:  [phone] Hours of Operation:  9 am - 6 pm, M-F.  Also accepts Medicaid/Medicare and self-pay.  [facility]  [address], [place] Phone: [phone], Fax: [phone]. Hours of Operation:  8:30 am - 5:30 pm, M-F.  Also accepts Medicaid and self-pay.  [facility] [place] [address] [place] Phone: [phone]   [place], [place], [place] [phone], Ext. [phone] Mondays & Thursdays: 7-9 AM.  First 15 patients are seen on a first come, first serve basis.    [place] Providers:  Organization         Address  Phone   Notes  [facility] [address], [place] [phone] Also accepts self-pay patients.  [facility]  [address], [place]  [phone]   [place], [address], [place] [phone]   [facility] [address] [place] [phone]   Lucianne Lei [address], [place]   [phone] Only accepts [place] Access [place] patients after they have their name applied to their card.   Self-Pay (no insurance) in [facility]:  Organization         Address  Phone   Notes  Sickle Cell Patients, [facility] [place] [phone]   [facility] [place] [phone]   [facility] [place]  [place], [place], [place] [phone]   [facility]/Dr. Osei-Bonsu  [address], [place] or [address], [place] [phone] Phone number for both [place] and [place] locations is the same.  Urgent Medical and [facility] [address] [place] [phone]   [facility] [address] [place] or [address] [phone]   [facility] [address] [place] [phone], phone; [phone], fax Sees patients 1st and 3rd Saturday of every month.  Must not qualify for public or private insurance (i.e. Medicaid, Medicare, [place] Health Choice, Veterans' Benefits)  Household income should be no more than 200% of the poverty level The clinic cannot treat you if you are pregnant or think you are pregnant  Sexually transmitted diseases are not treated at the clinic.    Dental Care: Organization         Address  Phone  Notes  [facility] [place] [phone] Accepts children up to age 31 who are enrolled in [place] or [place]; pregnant women with a Medicaid card; and children who have  applied for Medicaid or [place] Health Choice, but were declined, whose parents can pay a reduced fee at time of service.  [facility]  [address], [place] [phone] Accepts children up to age 41 who are enrolled in [place] or [place]; pregnant women with a Medicaid card; and children who have applied for Medicaid or [place] Health Choice, but were declined, whose parents can pay a reduced fee at time of service.  [facility]  [place] [phone] Patients are seen by appointment only. Walk-ins are not accepted. [place] will see patients 30 years of age and older. Monday - Tuesday (8am-5pm) Most Wednesdays (8:30-5pm) $30 per visit, cash only  [facility] [employer] PRO[facility]  [address], [employer] [phone])  [phone] Patients are seen by appointment only. Walk-ins are not accepted. [place] will see patients 2 years of age and older. One Wednesday Evening (Monthly: Volunteer Based).  $30 per visit, cash only  [place]  [phone] for adults; Children under age 37, call Graduate Pediatric Dentistry at [phone]. Children aged 25-14, please call [phone] to request a pediatric application.  Dental services are provided in all areas of dental care including fillings, crowns and bridges, complete and partial dentures, implants, gum treatment, root canals, and extractions. Preventive care is also provided. Treatment is provided to both adults and children. Patients are selected via a lottery and there is often a waiting list.   [facility] [address], [place]  [phone] [email or web address]   [facility] [address] [place], [place] [phone], Ext. [phone] Second and Fourth Thursday of each month, opens at 6:30 AM; Clinic ends at 9 AM.  Patients are seen on a first-come first-served basis, and a  limited number are seen during each clinic.   [facility]  [address] Hillard Danker [place], [place] [phone]   Eligibility Requirements You must have lived in [place], [place], or [place] counties for at least the last three months.   You cannot be eligible for state or federal sponsored [employer], including [employer], [place], or [employer].   You generally cannot be eligible for healthcare insurance through your employer.    How to apply: Eligibility screenings are held every Tuesday and Wednesday afternoon from 1:00 pm until 4:00 pm. You do not need an appointment for the interview!  [facility] [address] [place], [place]   [place]  [place] Department  [place]  [phone]    Behavioral Health Resources in the Community: Intensive Outpatient Programs Organization         Address  Phone  Notes  [place]. [address], [place], [place] [phone]   [facility] [address], [place], [place]   ADS: Alcohol [facility] Drug S[facility]cs [address], [place], [place]   [place] [address]  [place], [place] or [phone]   Substance Abuse Resources Organization         Address  Phone  Notes  [facility] Drug Services  4056175404   [place]  [phone]   [facility] [place]   Chinita Pester  [phone]   Residential & Outpatient Substance Abuse Program  2048055460   Psychological Services Organization         Address  Phone  Notes  [facility]  [place]  [phone]   [place] [address] [place] or [phone]    Mobile Crisis Teams Organization          Address  Phone  Notes  [facility], Mobile Crisis Care Unit  [phone]   [facility]  [address] [place], [place]   [place] Levels [address] [place] [phone]    Self-Help/Support Groups Organization         Address  Phone             Notes  [place] of [place] - variety of support groups  [phone]  [phone] Call for more information  Narcotics Anonymous (NA), [facility] [address], [employer] [place]  2 meetings at this location   Residential Facilities manager         Address  Phone  Notes  [facility] [place],    [place]  [phone]   [facility]  [address] [place] [address], [place], [place]   [place], [place] [phone] Admissions: 8am-3pm M-F  Incentives Substance [place] [address]    [place], [place] [phone]   [facility] [address] [place], [place], [place]   [facility] [address]  [place], [place]   [facility] Programs - Intensive Outpatient [address], [place], [place]   [facility] ([place] [address].,  [place], [place] [phone] or [phone]   [facility] (RTS) [address] [place], [place] Accepts Medicaid  [facility] [place] [address]  [place] 1-5144220242 Substance Abuse/Addiction Treatment   [facility] Organization         Address  Phone  Notes  [facility]  [phone]   Domenic Schwab, PhD [address] [place]lis [place], [place]   [phone] or [phone]   [place], [place] [phone]   [facility] [address] [place], [place] [phone] Insurance/Medicaid/sponsorship  through [facility] and Families [address] [place]                                    [place], [place] [phone] [place] [address], [place] [phone]    Dr. Adele Schilder  [phone]   [facility] Dept. 1) [address] [place] 2) [place] 3)  [place] [address], [place] [phone]  [phone]   [place] [phone] or [phone] (After Hours)

## 2014-09-18 NOTE — ED Notes (Signed)
Patient transported to Ultrasound 

## 2014-09-24 ENCOUNTER — Emergency Department (HOSPITAL_COMMUNITY)
Admission: EM | Admit: 2014-09-24 | Discharge: 2014-09-24 | Disposition: A | Discharge status: Home / Self Care | Payer: Self-pay | Attending: Emergency Medicine | Admitting: Emergency Medicine

## 2014-09-24 ENCOUNTER — Encounter (HOSPITAL_COMMUNITY): Payer: Self-pay

## 2014-09-24 DIAGNOSIS — Z720 Tobacco use: Secondary | ICD-10-CM | POA: Insufficient documentation

## 2014-09-24 DIAGNOSIS — J029 Acute pharyngitis, unspecified: Secondary | ICD-10-CM | POA: Insufficient documentation

## 2014-09-24 DIAGNOSIS — F419 Anxiety disorder, unspecified: Secondary | ICD-10-CM | POA: Insufficient documentation

## 2014-09-24 DIAGNOSIS — Z79899 Other long term (current) drug therapy: Secondary | ICD-10-CM | POA: Insufficient documentation

## 2014-09-24 DIAGNOSIS — I10 Essential (primary) hypertension: Secondary | ICD-10-CM | POA: Insufficient documentation

## 2014-09-24 DIAGNOSIS — K029 Dental caries, unspecified: Secondary | ICD-10-CM | POA: Insufficient documentation

## 2014-09-24 DIAGNOSIS — G8929 Other chronic pain: Secondary | ICD-10-CM | POA: Insufficient documentation

## 2014-09-24 DIAGNOSIS — F329 Major depressive disorder, single episode, unspecified: Secondary | ICD-10-CM | POA: Insufficient documentation

## 2014-09-24 DIAGNOSIS — K089 Disorder of teeth and supporting structures, unspecified: Secondary | ICD-10-CM

## 2014-09-24 MED ORDER — NAPROXEN 500 MG PO TABS: 500.0000 mg | ORAL_TABLET | Freq: Two times a day (BID) | ORAL | Status: DC | PRN

## 2014-09-24 MED ORDER — HYDROCODONE-ACETAMINOPHEN 5-325 MG PO TABS
1.0000 | ORAL_TABLET | Freq: Once | ORAL | Status: AC
  Administered 2014-09-24: 1 via ORAL
  Filled 2014-09-24: qty 1

## 2014-09-24 MED ORDER — HYDROCODONE-ACETAMINOPHEN 5-325 MG PO TABS: 1.0000 | ORAL_TABLET | Freq: Four times a day (QID) | ORAL | Status: DC | PRN

## 2014-09-24 MED ORDER — DOXYCYCLINE HYCLATE 100 MG PO CAPS: 100.0000 mg | ORAL_CAPSULE | Freq: Two times a day (BID) | ORAL | Status: DC

## 2014-09-24 NOTE — Discharge Instructions (Signed)
Apply warm compresses to jaw throughout the day. Take antibiotic until finished. Take naprosyn and norco as directed, as needed for pain but do not drive or operate machinery with pain medication use. Followup with a dentist is very important for ongoing evaluation and management of recurrent dental pain. STOP SMOKING! Return to emergency department for emergent changing or worsening symptoms.   Dental Caries Dental caries (also called tooth decay) is the most common oral disease. It can occur at any age but is more common in children and young adults.  HOW DENTAL CARIES DEVELOPS  The process of decay begins when bacteria and foods (particularly sugars and starches) combine in your mouth to produce plaque. Plaque is a substance that sticks to the hard, outer surface of a tooth (enamel). The bacteria in plaque produce acids that attack enamel. These acids may also attack the root surface of a tooth (cementum) if it is exposed. Repeated attacks dissolve these surfaces and create holes in the tooth (cavities). If left untreated, the acids destroy the other layers of the tooth.  RISK FACTORS  Frequent sipping of sugary beverages.   Frequent snacking on sugary and starchy foods, especially those that easily get stuck in the teeth.   Poor oral hygiene.   Dry mouth.   Substance abuse such as methamphetamine abuse.   Broken or poor-fitting dental restorations.   Eating disorders.   Gastroesophageal reflux disease (GERD).   Certain radiation treatments to the head and neck. SYMPTOMS In the early stages of dental caries, symptoms are seldom present. Sometimes white, chalky areas may be seen on the enamel or other tooth layers. In later stages, symptoms may include:  Pits and holes on the enamel.  Toothache after sweet, hot, or cold foods or drinks are consumed.  Pain around the tooth.  Swelling around the tooth. DIAGNOSIS  Most of the time, dental caries is detected during a  regular dental checkup. A diagnosis is made after a thorough medical and dental history is taken and the surfaces of your teeth are checked for signs of dental caries. Sometimes special instruments, such as lasers, are used to check for dental caries. Dental X-ray exams may be taken so that areas not visible to the eye (such as between the contact areas of the teeth) can be checked for cavities.  TREATMENT  If dental caries is in its early stages, it may be reversed with a fluoride treatment or an application of a remineralizing agent at the dental office. Thorough brushing and flossing at home is needed to aid these treatments. If it is in its later stages, treatment depends on the location and extent of tooth destruction:   If a small area of the tooth has been destroyed, the destroyed area will be removed and cavities will be filled with a material such as gold, silver amalgam, or composite resin.   If a large area of the tooth has been destroyed, the destroyed area will be removed and a cap (crown) will be fitted over the remaining tooth structure.   If the center part of the tooth (pulp) is affected, a procedure called a root canal will be needed before a filling or crown can be placed.   If most of the tooth has been destroyed, the tooth may need to be pulled (extracted). HOME CARE INSTRUCTIONS You can prevent, stop, or reverse dental caries at home by practicing good oral hygiene. Good oral hygiene includes:  Thoroughly cleaning your teeth at least twice a day with  a toothbrush and dental floss.   Using a fluoride toothpaste. A fluoride mouth rinse may also be used if recommended by your dentist or health care provider.   Restricting the amount of sugary and starchy foods and sugary liquids you consume.   Avoiding frequent snacking on these foods and sipping of these liquids.   Keeping regular visits with a dentist for checkups and cleanings. PREVENTION   Practice good oral  hygiene.  Consider a dental sealant. A dental sealant is a coating material that is applied by your dentist to the pits and grooves of teeth. The sealant prevents food from being trapped in them. It may protect the teeth for several years.  Ask about fluoride supplements if you live in a community without fluorinated water or with water that has a low fluoride content. Use fluoride supplements as directed by your dentist or health care provider.  Allow fluoride varnish applications to teeth if directed by your dentist or health care provider. Document Released: 11/20/2001 Document Revised: 07/15/2013 Document Reviewed: 03/02/2012 [facility] Patient Information 2015 [place], [place]. This information is not intended to replace advice given to you by your health care provider. Make sure you discuss any questions you have with your health care provider.  Dental Pain A tooth ache may be caused by cavities (tooth decay). Cavities expose the nerve of the tooth to air and hot or cold temperatures. It may come from an infection or abscess (also called a boil or furuncle) around your tooth. It is also often caused by dental caries (tooth decay). This causes the pain you are having. DIAGNOSIS  Your caregiver can diagnose this problem by exam. TREATMENT   If caused by an infection, it may be treated with medications which kill germs (antibiotics) and pain medications as prescribed by your caregiver. Take medications as directed.  Only take over-the-counter or prescription medicines for pain, discomfort, or fever as directed by your caregiver.  Whether the tooth ache today is caused by infection or dental disease, you should see your dentist as soon as possible for further care. SEEK MEDICAL CARE IF: The exam and treatment you received today has been provided on an emergency basis only. This is not a substitute for complete medical or dental care. If your problem worsens or new problems (symptoms) appear, and  you are unable to meet with your dentist, call or return to this location. SEEK IMMEDIATE MEDICAL CARE IF:   You have a fever.  You develop redness and swelling of your face, jaw, or neck.  You are unable to open your mouth.  You have severe pain uncontrolled by pain medicine. MAKE SURE YOU:   Understand these instructions.  Will watch your condition.  Will get help right away if you are not doing well or get worse. Document Released: 02/28/2005 Document Revised: 05/23/2011 Document Reviewed: 10/17/2007 [facility] Patient Information 2015 [place], [place]. This information is not intended to replace advice given to you by your health care provider. Make sure you discuss any questions you have with your health care provider.  Smoking Cessation, Tips for Success If you are ready to quit smoking, congratulations! You have chosen to help yourself be healthier. Cigarettes bring nicotine, tar, carbon monoxide, and other irritants into your body. Your lungs, heart, and blood vessels will be able to work better without these poisons. There are many different ways to quit smoking. Nicotine gum, nicotine patches, a nicotine inhaler, or nicotine nasal spray can help with physical craving. Hypnosis, support groups, and  medicines help break the habit of smoking. WHAT THINGS CAN I DO TO MAKE QUITTING EASIER?  Here are some tips to help you quit for good:  Pick a date when you will quit smoking completely. Tell all of your friends and family about your plan to quit on that date.  Do not try to slowly cut down on the number of cigarettes you are smoking. Pick a quit date and quit smoking completely starting on that day.  Throw away all cigarettes.   Clean and remove all ashtrays from your home, work, and car.  On a card, write down your reasons for quitting. Carry the card with you and read it when you get the urge to smoke.  Cleanse your body of nicotine. Drink enough water and fluids to keep your  urine clear or pale yellow. Do this after quitting to flush the nicotine from your body.  Learn to predict your moods. Do not let a bad situation be your excuse to have a cigarette. Some situations in your life might tempt you into wanting a cigarette.  Never have "just one" cigarette. It leads to wanting another and another. Remind yourself of your decision to quit.  Change habits associated with smoking. If you smoked while driving or when feeling stressed, try other activities to replace smoking. Stand up when drinking your coffee. Brush your teeth after eating. Sit in a different chair when you read the paper. Avoid alcohol while trying to quit, and try to drink fewer caffeinated beverages. Alcohol and caffeine may urge you to smoke.  Avoid foods and drinks that can trigger a desire to smoke, such as sugary or spicy foods and alcohol.  Ask people who smoke not to smoke around you.  Have something planned to do right after eating or having a cup of coffee. For example, plan to take a walk or exercise.  Try a relaxation exercise to calm you down and decrease your stress. Remember, you may be tense and nervous for the first 2 weeks after you quit, but this will pass.  Find new activities to keep your hands busy. Play with a pen, coin, or rubber band. Doodle or draw things on paper.  Brush your teeth right after eating. This will help cut down on the craving for the taste of tobacco after meals. You can also try mouthwash.   Use oral substitutes in place of cigarettes. Try using lemon drops, carrots, cinnamon sticks, or chewing gum. Keep them handy so they are available when you have the urge to smoke.  When you have the urge to smoke, try deep breathing.  Designate your home as a nonsmoking area.  If you are a heavy smoker, ask your health care provider about a prescription for nicotine chewing gum. It can ease your withdrawal from nicotine.  Reward yourself. Set aside the cigarette  money you save and buy yourself something nice.  Look for support from others. Join a support group or smoking cessation program. Ask someone at home or at work to help you with your plan to quit smoking.  Always ask yourself, "Do I need this cigarette or is this just a reflex?" Tell yourself, "Today, I choose not to smoke," or "I do not want to smoke." You are reminding yourself of your decision to quit.  Do not replace cigarette smoking with electronic cigarettes (commonly called e-cigarettes). The safety of e-cigarettes is unknown, and some may contain harmful chemicals.  If you relapse, do not give up! Plan  ahead and think about what you will do the next time you get the urge to smoke. HOW WILL I FEEL WHEN I QUIT SMOKING? You may have symptoms of withdrawal because your body is used to nicotine (the addictive substance in cigarettes). You may crave cigarettes, be irritable, feel very hungry, cough often, get headaches, or have difficulty concentrating. The withdrawal symptoms are only temporary. They are strongest when you first quit but will go away within 10-14 days. When withdrawal symptoms occur, stay in control. Think about your reasons for quitting. Remind yourself that these are signs that your body is healing and getting used to being without cigarettes. Remember that withdrawal symptoms are easier to treat than the major diseases that smoking can cause.  Even after the withdrawal is over, expect periodic urges to smoke. However, these cravings are generally short lived and will go away whether you smoke or not. Do not smoke! WHAT RESOURCES ARE AVAILABLE TO HELP ME QUIT SMOKING? Your health care provider can direct you to community resources or hospitals for support, which may include:  Group support.  Education.  Hypnosis.  Therapy. Document Released: 11/27/2003 Document Revised: 07/15/2013 Document Reviewed: 08/16/2012 [facility] Patient Information 2015 [place], [place]. This  information is not intended to replace advice given to you by your health care provider. Make sure you discuss any questions you have with your health care provider.   Emergency Department Resource Guide 1) Find a Doctor and Pay Out of Pocket Although you won't have to find out who is covered by your insurance plan, it is a good idea to ask around and get recommendations. You will then need to call the office and see if the doctor you have chosen will accept you as a new patient and what types of options they offer for patients who are self-pay. Some doctors offer discounts or will set up payment plans for their patients who do not have insurance, but you will need to ask so you aren't surprised when you get to your appointment.  2) Contact Your Local Health Department Not all health departments have doctors that can see patients for sick visits, but many do, so it is worth a call to see if yours does. If you don't know where your local health department is, you can check in your phone book. The CDC also has a tool to help you locate your state's health department, and many state websites also have listings of all of their local health departments.  3) Find a Walk-in Clinic If your illness is not likely to be very severe or complicated, you may want to try a walk in clinic. These are popping up all over the country in pharmacies, drugstores, and shopping centers. They're usually staffed by nurse practitioners or physician assistants that have been trained to treat common illnesses and complaints. They're usually fairly quick and inexpensive. However, if you have serious medical issues or chronic medical problems, these are probably not your best option.  No Primary Care Doctor: - Call [facility] at  [phone] - they can help you locate a primary care doctor that  accepts your insurance, provides certain services, etc. - Physician Referral Service- [phone]  Chronic Pain  Problems: Organization         Address  Phone   Notes  [facility]  [phone] Patients need to be referred by their primary care doctor.   Medication Assistance: Organization         Address  Phone   Notes  [facility] [address] [place] [place], [place] [address] [phone] --Must be a resident of [facility] -- Must have NO insurance coverage whatsoever (no Medicaid/ Medicare, etc.) -- The pt. MUST have a primary care doctor that directs their care regularly and follows them in the community   [facility]  [phone]   [place]  [phone]     Dental Care: Organization         Address  Phone  Notes  [facility] Department of [facility] [address] [place], [place] [phone] Accepts children up to age 12 who are enrolled in [place] or [place] Health Choice; pregnant women with a Medicaid card; and children who have applied for Medicaid or [place] Health Choice, but were declined, whose parents can pay a reduced fee at time of service.  [facility] Department of [facility]  [address], [place] [phone] Accepts children up to age 76 who are enrolled in [place] or [place] Health Choice; pregnant women with a Medicaid card; and children who have applied for Medicaid or [place] Health Choice, but were declined, whose parents can pay a reduced fee at time of service.  [facility]  [address] [place], [place] [phone] Patients are seen by appointment only. Walk-ins are not accepted. [facility] will see patients 36 years of age and older. Monday - Tuesday (8am-5pm) Most Wednesdays (8:30-5pm) $30 per visit, cash only  [facility]  [address], [facility] [phone] Patients are seen by appointment only. Walk-ins are not accepted. [facility] will see patients 59 years of age and  older. One Wednesday Evening (Monthly: Volunteer Based).  $30 per visit, cash only  [employer]  [phone] for adults; Children under age 43, call Graduate Pediatric Dentistry at [phone]. Children aged 38-14, please call [phone] to request a pediatric application.  Dental services are provided in all areas of dental care including fillings, crowns and bridges, complete and partial dentures, implants, gum treatment, root canals, and extractions. Preventive care is also provided. Treatment is provided to both adults and children. Patients are selected via a lottery and there is often a waiting list.   [facility] [address] [place]  [phone] [email or web address]   [facility] [address] [place], [place] [phone], Ext. [phone] Second and Fourth Thursday of each month, opens at 6:30 AM; Clinic ends at 9 AM.  Patients are seen on a first-come first-served basis, and a limited number are seen during each clinic.   [facility]  [address] [place], [place] [phone]   Eligibility Requirements You must have lived in [place], [place], or [place] counties for at least the last three months.   You cannot be eligible for state or federal sponsored [place], including [employer], [place], or [employer].   You generally cannot be eligible for healthcare insurance through your employer.    How to apply: Eligibility screenings are held every Tuesday and Wednesday afternoon from 1:00 pm until 4:00 pm. You do not need an appointment for the interview!  [facility] [address], [place], [place] [phone]   [facility]  [phone]   [facility]  [phone]   [facility]  [phone]

## 2014-09-24 NOTE — ED Provider Notes (Signed)
CSN: [id number]     Arrival date & time 09/24/14  1414 History  This chart was scribed for non-physician practitioner, Allen Derry, PA-C working with Pricilla Loveless, MD by Placido Sou, ED scribe. This patient was seen in room WTR9/WTR9 and the patient's care was started at 2:52 PM.   Chief Complaint  Patient presents with  . Dental Pain   Patient is a 29 y.o. female presenting with tooth pain. The history is provided by the patient. No language interpreter was used.  Dental Pain Location:  Lower Lower teeth location:  17/LL 3rd molar Quality:  Throbbing Severity:  Severe Onset quality:  Gradual Duration:  1 month Timing:  Constant Progression:  Worsening Chronicity:  Chronic Context: dental caries and poor dentition   Relieved by:  NSAIDs (and norco) Worsened by:  Cold food/drink and pressure Ineffective treatments:  None tried Associated symptoms: headaches (due to dental pain)   Associated symptoms: no difficulty swallowing, no drooling, no facial swelling, no fever, no gum swelling, no neck swelling, no oral bleeding and no trismus     HPI Comments: Laurie Holmes is a 29 y.o. female with no significant PMHx who presents to the Emergency department complaining of left lower back dental pain onset 1-2 months . Pt pain is 10/10, constant, throbbing, and it radiates down the left side of her neck. She states that air and applying pressure worsens her pain. She reports that she has tried norco as needed as well as completed her naproxen Rx with minimal relief for her symptoms. Pt has just moved to the area and is in the process of acquiring insurance. She reports that when she last had dental work completed, her dentist reported that she needs to have a root canal. Additionally she reports some mild headaches due to the dental pain. She denies gum swelling/bleeding/drainage, fever, chills, neck stiffness, trismus, drooling, sore throat, rhinorrhea, CP, abdominal pain, n/v,  numbness, tingling, weakness, and any other symptoms. Pt denies being allergic to any medications. Pt reports that she is a current smoker but has cut back even more due to the pain.   Past Medical History  Diagnosis Date  . Hypertension   . Anxiety   . Depression    No past surgical history on file. No family history on file. History  Substance Use Topics  . Smoking status: Current Every Day Smoker    Types: Cigarettes  . Smokeless tobacco: Never Used  . Alcohol Use: Yes     Comment: rare   OB History    No data available     Review of Systems  Constitutional: Negative for fever, chills and fatigue.  HENT: Positive for dental problem and sore throat. Negative for drooling, ear pain, facial swelling, rhinorrhea and trouble swallowing.   Respiratory: Negative for shortness of breath.   Cardiovascular: Negative for chest pain.  Gastrointestinal: Negative for nausea, vomiting, abdominal pain, diarrhea and constipation.  Musculoskeletal: Negative for myalgias and arthralgias.  Allergic/Immunologic: Negative for immunocompromised state.  Neurological: Positive for headaches (due to dental pain). Negative for weakness and numbness.   A complete 10 system review of systems was obtained and all systems are negative except as noted in the HPI and PMH.  Allergies  Review of patient's allergies indicates no known allergies.  Home Medications   Prior to Admission medications   Medication Sig Start Date End Date Taking? Authorizing Provider  famotidine (PEPCID) 20 MG tablet Take 1 tablet (20 mg total) by mouth 2 (two) times  daily. 09/18/14   Jaynie Crumble, PA-C  FLUoxetine (PROZAC) 20 MG tablet Take 20 mg by mouth daily.    Historical Provider, MD  FLUoxetine (PROZAC) 20 MG tablet Take 1 tablet (20 mg total) by mouth daily. 09/18/14   Tatyana Kirichenko, PA-C  HYDROcodone-acetaminophen (NORCO) 5-325 MG per tablet Take 1 tablet by mouth every 6 (six) hours as needed for severe pain.  09/13/14   Emmie Frakes Camprubi-Soms, PA-C  naproxen (NAPROSYN) 500 MG tablet Take 1 tablet (500 mg total) by mouth 2 (two) times daily as needed for mild pain, moderate pain or headache (TAKE WITH MEALS.). 09/13/14   Maxene Byington Camprubi-Soms, PA-C  sucralfate (CARAFATE) 1 G tablet Take 1 tablet (1 g total) by mouth 4 (four) times daily -  with meals and at bedtime. 09/18/14   Tatyana Kirichenko, PA-C   BP 176/98 mmHg  Pulse 84  Temp(Src) 97.9 F (36.6 C) (Oral)  Resp 20  SpO2 99%  LMP 08/14/2014 Physical Exam  Constitutional: She is oriented to person, place, and time. Vital signs are normal. She appears well-developed and well-nourished.  Non-toxic appearance. No distress.  Afebrile, nontoxic, NAD  HENT:  Head: Normocephalic and atraumatic.  Nose: Nose normal.  Mouth/Throat: Uvula is midline, oropharynx is clear and moist and mucous membranes are normal. No trismus in the jaw. Dental caries present. No dental abscesses or uvula swelling.  Left lower molar #17 decayed with filling, without surrounding gingival erythema or swelling, no evidence of abscess. No drainage. Nose clear. Oropharynx clear and moist, without uvular swelling or deviation, no trismus or drooling, no tonsillar swelling or erythema, no exudates.  Eyes: Conjunctivae and EOM are normal. Right eye exhibits no discharge. Left eye exhibits no discharge.  Neck: Normal range of motion. Neck supple.  Cardiovascular: Normal rate.   Pulmonary/Chest: Effort normal. No respiratory distress.  Abdominal: Normal appearance. She exhibits no distension.  Musculoskeletal: Normal range of motion.  Neurological: She is alert and oriented to person, place, and time. She has normal strength. No sensory deficit.  Skin: Skin is warm, dry and intact. No rash noted.  Psychiatric: She has a normal mood and affect. Her behavior is normal.  Nursing note and vitals reviewed.   ED Course  Procedures  DIAGNOSTIC STUDIES: Oxygen Saturation is 99% on RA,  normal by my interpretation.    COORDINATION OF CARE: 2:58 PM Discussed treatment plan with pt at bedside and pt agreed to plan.  Labs Review Labs Reviewed - No data to display  Imaging Review No results found.   EKG Interpretation None      MDM   Final diagnoses:  Pain due to dental caries  Chronic dental pain  HTN (hypertension), benign  Tobacco abuse    30 y.o. female here with Dental pain associated with dental caries and possible dental infection with patient afebrile, non toxic appearing and swallowing secretions well. I gave patient referral to dentist and stressed the importance of dental follow up for ultimate management of dental pain.  I have also discussed reasons to return immediately to the ER.  Patient expresses understanding and agrees with plan.  I will also give doxycycline and pain control.  Smoking cessation discussed. Also discussed that she cannot continue coming to ER for chronic dental pain, but given that it worsened will start abx. Also discussed that this will be the last time I will give narcotics.   I personally performed the services described in this documentation, which was scribed in my presence. The recorded information  has been reviewed and is accurate.  BP 176/98 mmHg  Pulse 84  Temp(Src) 97.9 F (36.6 C) (Oral)  Resp 20  SpO2 99%  LMP 08/14/2014  Meds ordered this encounter  Medications  . HYDROcodone-acetaminophen (NORCO/VICODIN) 5-325 MG per tablet 1 tablet    Sig:   . doxycycline (VIBRAMYCIN) 100 MG capsule    Sig: Take 1 capsule (100 mg total) by mouth 2 (two) times daily. One po bid x 7 days    Dispense:  14 capsule    Refill:  0    Order Specific Question:  Supervising Provider    Answer:  MILLER, BRIAN [[id number]]  . naproxen (NAPROSYN) 500 MG tablet    Sig: Take 1 tablet (500 mg total) by mouth 2 (two) times daily as needed for mild pain, moderate pain or headache (TAKE WITH MEALS.).    Dispense:  20 tablet    Refill:  0     Order Specific Question:  Supervising Provider    Answer:  MILLER, BRIAN [[id number]]  . HYDROcodone-acetaminophen (NORCO) 5-325 MG per tablet    Sig: Take 1 tablet by mouth every 6 (six) hours as needed for severe pain.    Dispense:  4 tablet    Refill:  0    Order Specific Question:  Supervising Provider    Answer:  Eber HongMILLER, BRIAN [[id number]]      Shaneca Orne Camprubi-Soms, PA-C 09/24/14 1527  Pricilla LovelessScott Goldston, MD 09/24/14 1739

## 2014-09-24 NOTE — ED Notes (Signed)
Pt with dental pain x 1 month.  Unable to get into a dentist.  Treated recently for same

## 2014-11-26 ENCOUNTER — Encounter (HOSPITAL_COMMUNITY): Payer: Self-pay | Admitting: Neurology

## 2014-11-26 ENCOUNTER — Emergency Department (HOSPITAL_COMMUNITY)
Admission: EM | Admit: 2014-11-26 | Discharge: 2014-11-26 | Disposition: A | Discharge status: Home / Self Care | Payer: Self-pay | Attending: Emergency Medicine | Admitting: Emergency Medicine

## 2014-11-26 DIAGNOSIS — R197 Diarrhea, unspecified: Secondary | ICD-10-CM | POA: Insufficient documentation

## 2014-11-26 DIAGNOSIS — R110 Nausea: Secondary | ICD-10-CM | POA: Insufficient documentation

## 2014-11-26 DIAGNOSIS — Z79899 Other long term (current) drug therapy: Secondary | ICD-10-CM | POA: Insufficient documentation

## 2014-11-26 DIAGNOSIS — I10 Essential (primary) hypertension: Secondary | ICD-10-CM | POA: Insufficient documentation

## 2014-11-26 DIAGNOSIS — Z720 Tobacco use: Secondary | ICD-10-CM | POA: Insufficient documentation

## 2014-11-26 DIAGNOSIS — R0602 Shortness of breath: Secondary | ICD-10-CM | POA: Insufficient documentation

## 2014-11-26 DIAGNOSIS — F32A Depression, unspecified: Secondary | ICD-10-CM

## 2014-11-26 DIAGNOSIS — Z3202 Encounter for pregnancy test, result negative: Secondary | ICD-10-CM | POA: Insufficient documentation

## 2014-11-26 DIAGNOSIS — F419 Anxiety disorder, unspecified: Secondary | ICD-10-CM | POA: Insufficient documentation

## 2014-11-26 DIAGNOSIS — R109 Unspecified abdominal pain: Secondary | ICD-10-CM | POA: Insufficient documentation

## 2014-11-26 DIAGNOSIS — F329 Major depressive disorder, single episode, unspecified: Secondary | ICD-10-CM | POA: Insufficient documentation

## 2014-11-26 LAB — LIPASE, BLOOD: LIPASE: 22 U/L (ref 22–51)

## 2014-11-26 LAB — CBC
HCT: 43.8 % (ref 36.0–46.0)
HEMOGLOBIN: 15.3 g/dL — AB (ref 12.0–15.0)
MCH: 33.3 pg (ref 26.0–34.0)
MCHC: 34.9 g/dL (ref 30.0–36.0)
MCV: 95.2 fL (ref 78.0–100.0)
Platelets: 226 10*3/uL (ref 150–400)
RBC: 4.6 MIL/uL (ref 3.87–5.11)
RDW: 12.2 % (ref 11.5–15.5)
WBC: 7.1 10*3/uL (ref 4.0–10.5)

## 2014-11-26 LAB — URINALYSIS, ROUTINE W REFLEX MICROSCOPIC
Bilirubin Urine: NEGATIVE
GLUCOSE, UA: NEGATIVE mg/dL
Ketones, ur: NEGATIVE mg/dL
LEUKOCYTES UA: NEGATIVE
Nitrite: NEGATIVE
PH: 6.5 (ref 5.0–8.0)
PROTEIN: NEGATIVE mg/dL
SPECIFIC GRAVITY, URINE: 1.025 (ref 1.005–1.030)
Urobilinogen, UA: 0.2 mg/dL (ref 0.0–1.0)

## 2014-11-26 LAB — COMPREHENSIVE METABOLIC PANEL
ALBUMIN: 4.1 g/dL (ref 3.5–5.0)
ALK PHOS: 60 U/L (ref 38–126)
ALT: 49 U/L (ref 14–54)
ANION GAP: 8 (ref 5–15)
AST: 46 U/L — ABNORMAL HIGH (ref 15–41)
BUN: 7 mg/dL (ref 6–20)
CALCIUM: 8.9 mg/dL (ref 8.9–10.3)
CHLORIDE: 104 mmol/L (ref 101–111)
CO2: 24 mmol/L (ref 22–32)
Creatinine, Ser: 0.67 mg/dL (ref 0.44–1.00)
GFR calc Af Amer: >60 mL/min (ref >60)
GFR calc non Af Amer: >60 mL/min (ref >60)
GLUCOSE: 121 mg/dL — AB (ref 65–99)
Potassium: 4.7 mmol/L (ref 3.5–5.1)
SODIUM: 136 mmol/L (ref 135–145)
Total Bilirubin: 0.9 mg/dL (ref 0.3–1.2)
Total Protein: 7.3 g/dL (ref 6.5–8.1)

## 2014-11-26 LAB — URINE MICROSCOPIC-ADD ON

## 2014-11-26 LAB — I-STAT BETA HCG BLOOD, ED (MC, WL, AP ONLY): I-stat hCG, quantitative: <5 m[IU]/mL (ref <5)

## 2014-11-26 MED ORDER — ONDANSETRON 4 MG PO TBDP
4.0000 mg | ORAL_TABLET | Freq: Once | ORAL | Status: AC
  Administered 2014-11-26: 4 mg via ORAL
  Filled 2014-11-26: qty 1

## 2014-11-26 MED ORDER — FLUOXETINE HCL 20 MG PO TABS: 20.0000 mg | ORAL_TABLET | Freq: Every day | ORAL | Status: DC

## 2014-11-26 MED ORDER — IBUPROFEN 800 MG PO TABS
800.0000 mg | ORAL_TABLET | Freq: Once | ORAL | Status: AC
Start: 2014-11-26 — End: 2014-11-26
  Administered 2014-11-26: 800 mg via ORAL
  Filled 2014-11-26: qty 1

## 2014-11-26 MED ORDER — ONDANSETRON 4 MG PO TBDP: 4.0000 mg | ORAL_TABLET | Freq: Three times a day (TID) | ORAL | Status: DC | PRN

## 2014-11-26 NOTE — ED Provider Notes (Signed)
She complained of mild headache and diarrhea earlier today which has since resolved. She she also stopped her Prozac on her own 2 weeks ago. She feels mildly depressed and has had vague thoughts of suicide. Though she is confident that she would not act out on thoughts of suicide. She feels comfortable going home and feels that she is safe at home and would not harm herself. She recently moved to [place] from another area. We will write prescription for Prozac and refer her to resource guide to get primary care physician and psychiatric follow-up  Doug Sou, MD 11/26/14 1416

## 2014-11-26 NOTE — ED Provider Notes (Signed)
CSN: [id number]     Arrival date & time 11/26/14  1026 History   First MD Initiated Contact with Patient 11/26/14 1115     Chief Complaint  Patient presents with  . Abdominal Pain  . Nausea  . Diarrhea  . Depression    HPI  Laurie Holmes is a 29 y.o. female with a PMH of HTN, anxiety, depression who presents to the ED with depression, headache, nausea, and diarrhea. She reports she stopped taking her prozac 2 weeks ago because she did not feel like taking it anymore, and has felt more depressed since that time. She states she often feels like she wants to die, however she reports she does not have a plan to harm herself, and that she does not think that she would actually do anything. She also reports mild headache, nausea, and diarrhea x 2 days. She denies sick contact, recent travel, antibiotic use. She states her headache is located over the left side of her head and is constant. She reports phonophobia. She has not tried anything for symptom relief. She reports intermittent nausea and diarrhea. She denies abdominal pain, emesis, or bloody stools. She states she had 5-6 episodes of loose stools yesterday. She has not tried anything for her symptoms. She denies fever, chills, dizziness, loss of consciousness, chest pain. She reports occasional shortness of breath and generalized fatigue. She denies dysuria, urgency, frequency, numbness, paresthesia, weakness.    Past Medical History  Diagnosis Date  . Hypertension   . Anxiety   . Depression    History reviewed. No pertinent past surgical history. No family history on file. Social History  Substance Use Topics  . Smoking status: Current Every Day Smoker    Types: Cigarettes  . Smokeless tobacco: Never Used  . Alcohol Use: Yes     Comment: rare   OB History    No data available      Review of Systems  Constitutional: Positive for fatigue. Negative for fever, chills, activity change and appetite change.  Respiratory: Positive  for shortness of breath.   Cardiovascular: Negative for chest pain, palpitations and leg swelling.  Gastrointestinal: Positive for nausea and diarrhea. Negative for vomiting, abdominal pain, constipation, blood in stool and abdominal distention.  Genitourinary: Negative for dysuria, urgency and frequency.  Musculoskeletal: Negative for myalgias, arthralgias, neck pain and neck stiffness.  Neurological: Negative for dizziness, syncope, weakness, light-headedness, numbness and headaches.  All other systems reviewed and are negative.     Allergies  Review of patient's allergies indicates no known allergies.  Home Medications   Prior to Admission medications   Medication Sig Start Date End Date Taking? Authorizing Provider  doxycycline (VIBRAMYCIN) 100 MG capsule Take 1 capsule (100 mg total) by mouth 2 (two) times daily. One po bid x 7 days 09/24/14   Mercedes Camprubi-Soms, PA-C  famotidine (PEPCID) 20 MG tablet Take 1 tablet (20 mg total) by mouth 2 (two) times daily. Patient not taking: Reported on 09/24/2014 09/18/14   Lemont Fillers Kirichenko, PA-C  FLUoxetine (PROZAC) 20 MG tablet Take 1 tablet (20 mg total) by mouth daily. 09/18/14   Tatyana Kirichenko, PA-C  HYDROcodone-acetaminophen (NORCO) 5-325 MG per tablet Take 1 tablet by mouth every 6 (six) hours as needed for severe pain. 09/13/14   Mercedes Camprubi-Soms, PA-C  HYDROcodone-acetaminophen (NORCO) 5-325 MG per tablet Take 1 tablet by mouth every 6 (six) hours as needed for severe pain. 09/24/14   Mercedes Camprubi-Soms, PA-C  ibuprofen (ADVIL,MOTRIN) 200 MG tablet Take 400  mg by mouth every 6 (six) hours as needed for headache, mild pain or moderate pain.    Historical Provider, MD  naproxen (NAPROSYN) 500 MG tablet Take 1 tablet (500 mg total) by mouth 2 (two) times daily as needed for mild pain, moderate pain or headache (TAKE WITH MEALS.). Patient not taking: Reported on 09/24/2014 09/13/14   Mercedes Camprubi-Soms, PA-C  naproxen (NAPROSYN)  500 MG tablet Take 1 tablet (500 mg total) by mouth 2 (two) times daily as needed for mild pain, moderate pain or headache (TAKE WITH MEALS.). 09/24/14   Mercedes Camprubi-Soms, PA-C  sucralfate (CARAFATE) 1 G tablet Take 1 tablet (1 g total) by mouth 4 (four) times daily -  with meals and at bedtime. Patient not taking: Reported on 09/24/2014 09/18/14   Tatyana Kirichenko, PA-C    BP 148/111 mmHg  Pulse 119  Temp(Src) 98.7 F (37.1 C) (Oral)  Resp 18  Ht 5\' 7"  (1.702 m)  Wt 260 lb (117.935 kg)  BMI 40.71 kg/m2  LMP  (LMP Unknown) Physical Exam  Constitutional: She is oriented to person, place, and time. She appears well-developed and well-nourished. No distress.  HENT:  Head: Normocephalic and atraumatic.  Right Ear: External ear normal.  Left Ear: External ear normal.  Nose: Nose normal.  Mouth/Throat: Uvula is midline, oropharynx is clear and moist and mucous membranes are normal.  Eyes: Conjunctivae, EOM and lids are normal. Pupils are equal, round, and reactive to light. Right eye exhibits no discharge. Left eye exhibits no discharge. No scleral icterus.  Neck: Normal range of motion. Neck supple.  Cardiovascular: Normal rate, regular rhythm, normal heart sounds, intact distal pulses and normal pulses.   Pulmonary/Chest: Effort normal and breath sounds normal. No respiratory distress. She has no wheezes. She has no rales.  Abdominal: Soft. Normal appearance and bowel sounds are normal. She exhibits no distension and no mass. There is no tenderness. There is no rigidity, no rebound and no guarding.  Musculoskeletal: Normal range of motion. She exhibits no edema or tenderness.  Neurological: She is alert and oriented to person, place, and time. She has normal strength. No cranial nerve deficit or sensory deficit.  Skin: Skin is warm, dry and intact. No rash noted. She is not diaphoretic. No erythema. No pallor.  Psychiatric: She has a normal mood and affect. Her speech is normal and  behavior is normal. Judgment and thought content normal.  Nursing note and vitals reviewed.   ED Course  Procedures (including critical care time)  Labs Review Labs Reviewed  COMPREHENSIVE METABOLIC PANEL - Abnormal; Notable for the following:    Glucose, Bld 121 (*)    AST 46 (*)    All other components within normal limits  CBC - Abnormal; Notable for the following:    Hemoglobin 15.3 (*)    All other components within normal limits  URINALYSIS, ROUTINE W REFLEX MICROSCOPIC (NOT AT [facility]) - Abnormal; Notable for the following:    APPearance HAZY (*)    Hgb urine dipstick SMALL (*)    All other components within normal limits  URINE MICROSCOPIC-ADD ON - Abnormal; Notable for the following:    Squamous Epithelial / LPF FEW (*)    Bacteria, UA FEW (*)    All other components within normal limits  LIPASE, BLOOD  I-STAT BETA HCG BLOOD, ED ([facility], [facility], [facility] ONLY)    Imaging Review No results found.   I have personally reviewed and evaluated these lab results as part of my medical  decision-making.   EKG Interpretation None      MDM   Final diagnoses:  Depression  Nausea  Diarrhea    29 year old female presents with depression, headache, nausea, diarrhea. Reports depression x 2 weeks since stopping prozac. States she has no plan, but has thought about harming herself intermittently. She states she does not think she would do anything and feels safe going home. Reports headache, nausea, diarrhea x 2 days. Denies lightheadedness, dizziness, loss of consciousness, abdominal pain, vomiting, bloody stools, dysuria, urgency, frequency.  Patient is afebrile. Vital signs stable. Normal neuro exam with no focal neuro deficit. Abdomen soft, non-tender, non-distended.  Given ibuprofen for headache and zofran for nausea.  CBC, CMP, lipase unremarkable. Beta hCG negative. UA no evidence of infection. Patient reports headache and nausea is now resolved.   Will discharge patient with zofran  for nausea and prozac x 1 month. Patient to follow-up with PCP. Resource list given. Return precautions discussed.   BP 149/91 mmHg  Pulse 95  Temp(Src) 98.7 F (37.1 C) (Oral)  Resp 16  Ht  (1.702 m)  Wt 260 lb (117.935 kg)  BMI 40.71 kg/m2  SpO2 99%  LMP  (LMP Unknown)    Mady Gemma, PA-C 11/26/14 2105  Doug Sou, MD 11/27/14 [phone]

## 2014-11-26 NOTE — ED Notes (Signed)
PA at bedside.

## 2014-11-26 NOTE — Discharge Instructions (Signed)
1. Medications: zofran, prozac, usual home medications 2. Treatment: rest, drink plenty of fluids 3. Follow Up: please followup with your primary doctor for discussion of your diagnoses and further evaluation after today's visit; if you do not have a primary care doctor use the resource guide provided to find one; please return to the ER for high fever, severe headache, persistent vomiting, blood in stool; if you have any thoughts of harming yourself or harming others, call 911 immediately   Depression Depression is feeling sad, low, down in the dumps, blue, gloomy, or empty. In general, there are two kinds of depression:  Normal sadness or grief. This can happen after something upsetting. It often goes away on its own within 2 weeks. After losing a loved one (bereavement), normal sadness and grief may last longer than two weeks. It usually gets better with time.  Clinical depression. This kind lasts longer than normal sadness or grief. It keeps you from doing the things you normally do in life. It is often hard to function at home, work, or at school. It may affect your relationships with others. Treatment is often needed. GET HELP RIGHT AWAY IF:  You have thoughts about hurting yourself or others.  You lose touch with reality (psychotic symptoms). You may:  See or hear things that are not real.  Have untrue beliefs about your life or people around you.  Your medicine is giving you problems. MAKE SURE YOU:  Understand these instructions.  Will watch your condition.  Will get help right away if you are not doing well or get worse. Document Released: 04/02/2010 Document Revised: 07/15/2013 Document Reviewed: 06/30/2011 [facility] Patient Information 2015 [place], [place]. This information is not intended to replace advice given to you by your health care provider. Make sure you discuss any questions you have with your health care provider.  Diarrhea Diarrhea is watery poop (stool). It  can make you feel weak, tired, thirsty, or give you a dry mouth (signs of dehydration). Watery poop is a sign of another problem, most often an infection. It often lasts 2-3 days. It can last longer if it is a sign of something serious. Take care of yourself as told by your doctor. HOME CARE   Drink 1 cup (8 ounces) of fluid each time you have watery poop.  Do not drink the following fluids:  Those that contain simple sugars (fructose, glucose, galactose, lactose, sucrose, maltose).  Sports drinks.  Fruit juices.  Whole milk products.  Sodas.  Drinks with caffeine (coffee, tea, soda) or alcohol.  Oral rehydration solution may be used if the doctor says it is okay. You may make your own solution. Follow this recipe:   - teaspoon table salt.   teaspoon baking soda.   teaspoon salt substitute containing potassium chloride.  1 tablespoons sugar.  1 liter (34 ounces) of water.  Avoid the following foods:  High fiber foods, such as raw fruits and vegetables.  Nuts, seeds, and whole grain breads and cereals.   Those that are sweetened with sugar alcohols (xylitol, sorbitol, mannitol).  Try eating the following foods:  Starchy foods, such as rice, toast, pasta, low-sugar cereal, oatmeal, baked potatoes, crackers, and bagels.  Bananas.  Applesauce.  Eat probiotic-rich foods, such as yogurt and milk products that are fermented.  Wash your hands well after each time you have watery poop.  Only take medicine as told by your doctor.  Take a warm bath to help lessen burning or pain from having watery poop.  GET HELP RIGHT AWAY IF:   You cannot drink fluids without throwing up (vomiting).  You keep throwing up.  You have blood in your poop, or your poop looks black and tarry.  You do not pee (urinate) in 6-8 hours, or there is only a small amount of very dark pee.  You have belly (abdominal) pain that gets worse or stays in the same spot (localizes).  You are weak,  dizzy, confused, or light-headed.  You have a very bad headache.  Your watery poop gets worse or does not get better.  You have a fever or lasting symptoms for more than 2-3 days.  You have a fever and your symptoms suddenly get worse. MAKE SURE YOU:   Understand these instructions.  Will watch your condition.  Will get help right away if you are not doing well or get worse. Document Released: 08/17/2007 Document Revised: 07/15/2013 Document Reviewed: 11/06/2011 [facility] Patient Information 2015 [place], [place]. This information is not intended to replace advice given to you by your health care provider. Make sure you discuss any questions you have with your health care provider.  Nausea, Adult Nausea means you feel sick to your stomach or need to throw up (vomit). It may be a sign of a more serious problem. If nausea gets worse, you may throw up. If you throw up a lot, you may lose too much body fluid (dehydration). HOME CARE   Get plenty of rest.  Ask your doctor how to replace body fluid losses (rehydrate).  Eat small amounts of food. Sip liquids more often.  Take all medicines as told by your doctor. GET HELP RIGHT AWAY IF:  You have a fever.  You pass out (faint).  You keep throwing up or have blood in your throw up.  You are very weak, have dry lips or a dry mouth, or you are very thirsty (dehydrated).  You have dark or bloody poop (stool).  You have very bad chest or belly (abdominal) pain.  You do not get better after 2 days, or you get worse.  You have a headache. MAKE SURE YOU:  Understand these instructions.  Will watch your condition.  Will get help right away if you are not doing well or get worse. Document Released: 02/17/2011 Document Revised: 05/23/2011 Document Reviewed: 02/17/2011 [facility] Patient Information 2015 [place], [place]. This information is not intended to replace advice given to you by your health care provider. Make sure you discuss  any questions you have with your health care provider.   Emergency Department Resource Guide 1) Find a Doctor and Pay Out of Pocket Although you won't have to find out who is covered by your insurance plan, it is a good idea to ask around and get recommendations. You will then need to call the office and see if the doctor you have chosen will accept you as a new patient and what types of options they offer for patients who are self-pay. Some doctors offer discounts or will set up payment plans for their patients who do not have insurance, but you will need to ask so you aren't surprised when you get to your appointment.  2) Contact Your Local Health Department Not all health departments have doctors that can see patients for sick visits, but many do, so it is worth a call to see if yours does. If you don't know where your local health department is, you can check in your phone book. The CDC also has a tool to  help you locate your state's health department, and many state websites also have listings of all of their local health departments.  3) Find a Walk-in Clinic If your illness is not likely to be very severe or complicated, you may want to try a walk in clinic. These are popping up all over the country in pharmacies, drugstores, and shopping centers. They're usually staffed by nurse practitioners or physician assistants that have been trained to treat common illnesses and complaints. They're usually fairly quick and inexpensive. However, if you have serious medical issues or chronic medical problems, these are probably not your best option.  No Primary Care Doctor: - Call Health Connect at  [phone] - they can help you locate a primary care doctor that  accepts your insurance, provides certain services, etc. - Physician Referral Service- [phone]  Chronic Pain Problems: Organization         Address  Phone   Notes  [facility]  [phone] Patients need to be  referred by their primary care doctor.   Medication Assistance: Organization         Address  Phone   Notes  [facility] Medication [facility] [address] [place] [place], [place] [address] [phone] --Must be a resident of [facility] -- Must have NO insurance coverage whatsoever (no Medicaid/ Medicare, etc.) -- The pt. MUST have a primary care doctor that directs their care regularly and follows them in the community   MedAssist  [phone]   [employer]  [phone]    Agencies that provide inexpensive medical care: Organization         Address  Phone   Notes  [facility]  [phone]   [facility]    [phone]   [facility] [address] [place], [place] [address] [phone]   [facility] [address], [place] [phone]   Planned Parenthood    [phone]   [facility]    [phone]   [facility]  [address], [place] Phone:  [phone], Fax:  [phone] Hours of Operation:  9 am - 6 pm, M-F.  Also accepts Medicaid/Medicare and self-pay.  [facility]  [address], [place] Phone: [phone], Fax: [phone]. Hours of Operation:  8:30 am - 5:30 pm, M-F.  Also accepts Medicaid and self-pay.  [facility] [place] [address] [place] Phone: [phone]   [facility] [address] Natasha Bence [place], [place] [phone], Ext. [phone] Mondays & Thursdays: 7-9 AM.  First 15 patients are seen on a first come, first serve basis.    Medicaid-accepting [facility] Providers:  Organization         Address  Phone   Notes  [facility] [address], [place] [phone] Also accepts self-pay patients.  [facility] [address] Laurell Josephs [place], [place]  [phone]   [facility] [address], [place] [phone]   [facility] [address] [place] [phone]   Renaye Rakers [address], [place]   [phone] Only accepts [place] Access [place] patients after they have their name applied to their card.   Self-Pay (no insurance) in [facility]:  Organization  Address  Phone   Notes  Sickle Cell Patients, [facility] [address] [place], [place] [phone]   [facility] [address] [place], [place] [phone]   [facility] [place]  [address], [place] [phone]   [facility]/Dr. Osei-Bonsu  [address], [place] or [address], [place] [phone] Phone number for both [place] and [place] locations is the same.  [facility]gent Medical and [facility] [address] [place] [phone]   [facility] [address] [place] or [address] [phone]   [facility] [address], [place] [phone], phone; [phone], fax Sees patients 1st and 3rd Saturday of every month.  Must not qualify for public or private insurance (i.e. Medicaid, Medicare, [place] Health Choice, Veterans' Benefits)  Household income should be no more than 200% of the poverty level The clinic cannot treat you if you are pregnant or think you are pregnant  Sexually transmitted diseases are not treated at the clinic.    Dental Care: Organization         Address  Phone  Notes  [facility] Department of [facility] [address] [place], [place] [phone] Accepts children up to age 41 who are enrolled in [place] or [place] Health Choice; pregnant women with a Medicaid card; and children who have applied for Medicaid or [place] Health Choice, but were declined, whose parents can  pay a reduced fee at time of service.  [facility] Department of [facility]  [address], [place] [phone] Accepts children up to age 40 who are enrolled in [place] or [place] Health Choice; pregnant women with a Medicaid card; and children who have applied for Medicaid or [place] Health Choice, but were declined, whose parents can pay a reduced fee at time of service.  [facility]  [address] [place], [place] [phone] Patients are seen by appointment only. Walk-ins are not accepted. [facility] will see patients 53 years of age and older. Monday - Tuesday (8am-5pm) Most Wednesdays (8:30-5pm) $30 per visit, cash only  [facility]  [address], [facility] [phone] Patients are seen by appointment only. Walk-ins are not accepted. [facility] will see patients 54 years of age and older. One Wednesday Evening (Monthly: Volunteer Based).  $30 per visit, cash only  [employer]  [phone] for adults; Children under age 90, call Graduate Pediatric Dentistry at [phone]. Children aged 69-14, please call [phone] to request a pediatric application.  Dental services are provided in all areas of dental care including fillings, crowns and bridges, complete and partial dentures, implants, gum treatment, root canals, and extractions. Preventive care is also provided. Treatment is provided to both adults and children. Patients are selected via a lottery and there is often a waiting list.   [facility] [address] [place]  [phone] [email or web address]   [facility] [address] [place], [place] [phone], Ext. [phone] Second and Fourth Thursday of each month, opens at 6:30 AM; Clinic ends at 9 AM.  Patients are seen on a first-come first-served basis, and a limited number are seen during each clinic.   [facility]  [address] [place], [place]  [place], [place] [phone]   Eligibility Requirements You must have lived in [place], [place], or [place] counties for at least the last three months.   You cannot be eligible for state or federal sponsored [place], including [employer], [place], or [employer].   You generally cannot be eligible for healthcare insurance through your employer.    How to apply: Eligibility screenings are held every Tuesday and Wednesday afternoon from 1:00 pm until 4:00 pm. You do not need an appointment for the interview!  [facility] [address] [place], [place] [phone]   [facility] Health Department  [phone]   [facility] Health Department  [phone]   [facility]  [phone]    Behavioral Health Resources in the Community: Intensive Outpatient Programs Organization         Address  Phone  Notes  [facility] [address] [place], [place] [phone]   [facility] Outpatient [address] [place], [place] [phone]   [facility] [address] [place], [place]  [phone]   [facility] Mental Health [address]  [place], [place] [phone] or [phone]   Substance Abuse Resources Organization         Address  Phone  Notes  [facility]  [phone]   [facility]  [phone]   [facility]  [phone]   Floydene Flock  [phone]   Residential & Outpatient Substance Abuse Program  [phone]   Psychological Services Organization         Address  Phone  Notes  [facility] Behavioral Health  [phone]   [facility]  [phone]   [facility] [address] [place] [phone] or [phone]    Mobile Crisis Teams Organization         Address  Phone  Notes  [facility], Mobile Crisis Care Unit  [phone]    [facility]  [address] [place], [place] [phone]   Doristine Locks [address] [place] [phone]    Self-Help/Support Groups Organization         Address  Phone             Notes  [facility] [place] - variety of support groups  336- I7437963 Call for more information  Narcotics Anonymous (NA), [facility] [address], [employer] [place]  2 meetings at this location   Statistician         Address  Phone  Notes  [facility] [address],    [place]  [phone]   [facility]  [address], [place] [address], [place], [place] [phone]   [facility] [address] [place], [place] [phone] Admissions: 8am-3pm M-F  [facility] [address],    [place], [place] [phone]   [facility] [address] Starling Manns [place], [place] [phone]   [facility] [address],  [place], [place] [phone]   [facility] - Intensive Outpatient [address] Laurell Joseph[address], [place], [place] [phone]   [facility] ([facility] [address] [place].,  [place], [place] [phone] or [phone]   [facility] ([facility]) [address], [place], [place] [phone] Accepts Medicaid  [facility] [address]  [place] [phone] Substance Abuse/Addiction Treatment   [facility] Resources Organization  Address  Phone  Notes  [facility]  4176885354   Domenic Schwab, PhD [address] Arlis [place], [place]   [phone] or [phone]   [place], [place] [phone]   [address], [place], [place] [phone] Insurance/Medicaid/sponsorship through [facility] [address] [place]                                     [place], [place] [phone] [place] [address], [place] [phone]    Dr. Adele Schilder  [phone]   [facility] Dept. 1) [address], [place] 2) [place] 3)  [address], [place] [phone]  [phone]   [place] [phone] or [phone] (After Hours)

## 2014-11-26 NOTE — ED Notes (Addendum)
Pt reports since yesterday has been having headache, lower middle abd pain, nausea and diarrhea. Pt stopped taking Prozac 2 weeks ago. Pt is crying, saying she wants to talk to someone about her mental health. "I am at risk of losing my job because I just can't get up in the morning". "I am feeling real depressed. I thought coming off my medicine would help".

## 2014-12-08 ENCOUNTER — Encounter (HOSPITAL_BASED_OUTPATIENT_CLINIC_OR_DEPARTMENT_OTHER): Payer: Self-pay | Admitting: *Deleted

## 2014-12-08 ENCOUNTER — Emergency Department (HOSPITAL_BASED_OUTPATIENT_CLINIC_OR_DEPARTMENT_OTHER)
Admission: EM | Admit: 2014-12-08 | Discharge: 2014-12-08 | Disposition: A | Discharge status: Home / Self Care | Payer: Self-pay | Attending: Physician Assistant | Admitting: Physician Assistant

## 2014-12-08 DIAGNOSIS — Z79899 Other long term (current) drug therapy: Secondary | ICD-10-CM | POA: Insufficient documentation

## 2014-12-08 DIAGNOSIS — Z720 Tobacco use: Secondary | ICD-10-CM | POA: Insufficient documentation

## 2014-12-08 DIAGNOSIS — K088 Other specified disorders of teeth and supporting structures: Secondary | ICD-10-CM | POA: Insufficient documentation

## 2014-12-08 DIAGNOSIS — F329 Major depressive disorder, single episode, unspecified: Secondary | ICD-10-CM | POA: Insufficient documentation

## 2014-12-08 DIAGNOSIS — R51 Headache: Secondary | ICD-10-CM | POA: Insufficient documentation

## 2014-12-08 DIAGNOSIS — I10 Essential (primary) hypertension: Secondary | ICD-10-CM | POA: Insufficient documentation

## 2014-12-08 DIAGNOSIS — R509 Fever, unspecified: Secondary | ICD-10-CM | POA: Insufficient documentation

## 2014-12-08 DIAGNOSIS — K0889 Other specified disorders of teeth and supporting structures: Secondary | ICD-10-CM

## 2014-12-08 DIAGNOSIS — F419 Anxiety disorder, unspecified: Secondary | ICD-10-CM | POA: Insufficient documentation

## 2014-12-08 MED ORDER — NAPROXEN 250 MG PO TABS
250.0000 mg | ORAL_TABLET | Freq: Once | ORAL | Status: AC
  Administered 2014-12-08: 250 mg via ORAL
  Filled 2014-12-08: qty 1

## 2014-12-08 MED ORDER — PENICILLIN V POTASSIUM 250 MG PO TABS
250.0000 mg | ORAL_TABLET | Freq: Once | ORAL | Status: AC
  Administered 2014-12-08: 250 mg via ORAL

## 2014-12-08 MED ORDER — PENICILLIN V POTASSIUM 250 MG PO TABS
ORAL_TABLET | ORAL | Status: DC
Start: 2014-12-08 — End: 2014-12-08
  Filled 2014-12-08: qty 1

## 2014-12-08 MED ORDER — NAPROXEN 500 MG PO TABS: 500.0000 mg | ORAL_TABLET | Freq: Two times a day (BID) | ORAL | Status: DC

## 2014-12-08 MED ORDER — PENICILLIN V POTASSIUM 500 MG PO TABS: 500.0000 mg | ORAL_TABLET | Freq: Four times a day (QID) | ORAL | Status: DC

## 2014-12-08 NOTE — ED Notes (Signed)
To ED via EMS with c.o dental pain since this am. Pain on and off x 2 months.

## 2014-12-08 NOTE — Discharge Instructions (Signed)
Dental Pain °A tooth ache may be caused by cavities (tooth decay). Cavities expose the nerve of the tooth to air and hot or cold temperatures. It may come from an infection or abscess (also called a boil or furuncle) around your tooth. It is also often caused by dental caries (tooth decay). This causes the pain you are having. °DIAGNOSIS  °Your caregiver can diagnose this problem by exam. °TREATMENT  °· If caused by an infection, it may be treated with medications which kill germs (antibiotics) and pain medications as prescribed by your caregiver. Take medications as directed. °· Only take over-the-counter or prescription medicines for pain, discomfort, or fever as directed by your caregiver. °· Whether the tooth ache today is caused by infection or dental disease, you should see your dentist as soon as possible for further care. °SEEK MEDICAL CARE IF: °The exam and treatment you received today has been provided on an emergency basis only. This is not a substitute for complete medical or dental care. If your problem worsens or new problems (symptoms) appear, and you are unable to meet with your dentist, call or return to this location. °SEEK IMMEDIATE MEDICAL CARE IF:  °· You have a fever. °· You develop redness and swelling of your face, jaw, or neck. °· You are unable to open your mouth. °· You have severe pain uncontrolled by pain medicine. °MAKE SURE YOU:  °· Understand these instructions. °· Will watch your condition. °· Will get help right away if you are not doing well or get worse. °Document Released: 02/28/2005 Document Revised: 05/23/2011 Document Reviewed: 10/17/2007 °ExitCare® Patient Information ©2015 [employer]. This information is not intended to replace advice given to you by your health care provider. Make sure you discuss any questions you have with your health care provider. ° °Emergency Department Resource Guide °1) Find a Doctor and Pay Out of Pocket °Although you won't have to find out who  is covered by your insurance plan, it is a good idea to ask around and get recommendations. You will then need to call the office and see if the doctor you have chosen will accept you as a new patient and what types of options they offer for patients who are self-pay. Some doctors offer discounts or will set up payment plans for their patients who do not have insurance, but you will need to ask so you aren't surprised when you get to your appointment. ° °2) Contact Your Local Health Department °Not all health departments have doctors that can see patients for sick visits, but many do, so it is worth a call to see if yours does. If you don't know where your local health department is, you can check in your phone book. The CDC also has a tool to help you locate your state's health department, and many state websites also have listings of all of their local health departments. ° °3) Find a Walk-in Clinic °If your illness is not likely to be very severe or complicated, you may want to try a walk in clinic. These are popping up all over the country in pharmacies, drugstores, and shopping centers. They're usually staffed by nurse practitioners or physician assistants that have been trained to treat common illnesses and complaints. They're usually fairly quick and inexpensive. However, if you have serious medical issues or chronic medical problems, these are probably not your best option. ° °No Primary Care Doctor: °- Call [facility] at  832-8000 - they can help you locate a primary   care doctor that  accepts your insurance, provides certain services, etc. °- Physician Referral Service- 1-800-533-3463 ° °Chronic Pain Problems: °Organization         Address  Phone   Notes  °[facility]  (336) 297-2271 Patients need to be referred by their primary care doctor.  ° °Medication Assistance: °Organization         Address  Phone   Notes  °[place] [facility] [address]  [address] °[place], [place] [address] (336) 641-8030 --Must be a resident of [place] °-- Must have NO insurance coverage whatsoever (no Medicaid/ Medicare, etc.) °-- The pt. MUST have a primary care doctor that directs their care regularly and follows them in the community °  °[facility]  (866) 331-1348   °[employer]  (888) 892-1162   ° °Agencies that provide inexpensive medical care: °Organization         Address  Phone   Notes  °[facility]  (336) 832-8035   °[facility]    (336) 832-7272   °[facility] [address] °[place], [place] [address] (336) 832-4777   °[facility] [address], °[place] (336) 271-4999   °[facility]    (336) 373-0678   °[facility]    (336) 272-1050   °[facility] ° [address], [place] Phone:  (336) 832-4444, Fax:  (336) 832-4440 Hours of Operation:  9 am - 6 pm, M-F.  Also accepts Medicaid/Medicare and self-pay.  °[facility] ° [address], [place] Phone: (336) [phone], Fax: (336) [phone]. Hours of Operation:  8:30 am - 5:30 pm, M-F.  Also accepts Medicaid and self-pay.  °[facility] [place] [address], [place] Phone: (336) 878-6027   °[facility] [address], [place], [place] (336)723-1848, Ext. 123 Mondays & Thursdays: 7-9 AM.  First 15 patients are seen on a first come, first serve basis. °  ° °Medicaid-accepting [place] Providers: ° °Organization         Address  Phone   Notes  °[facility] [address], [place] (336) 641-2100 Also accepts self-pay patients.  °[facility] [address], [place] ° (336) 856-9996   °[facility] [address], [place] (336) 288-8857   °[facility] [address] [place] [address], [place] (336) 299-7000   °Veita Bland [address], [place]  ° (336) 373-1557 Only accepts [place] Access Medicaid patients after they have their name applied to their card.  ° °Self-Pay (no insurance) in [place]: ° °Organization         Address  Phone   Notes  °Sickle Cell Patients, [facility] [address], [place] (336) 832-1970   °[facility] [address], [place] (336) 832-4400   °[facility] [place] ° [address], [place] (336) 992-4800   °[facility]/Dr. Osei-Bonsu ° [address] [place] [address], [place] or [address], [place] (336) 841-8500 Phone number for both [place] and [place] locations is the same.  °[facility] [address], [place] (336) 299-0000   °[facility] [address] [place] [address],  or [address] (336) 852-7530 °(336) 878-2260   °  [facility] [address], [place] (336) 350-1642, phone; (336) 294-5005, fax Sees patients 1st and 3rd Saturday of every month.  Must not qualify for public or private insurance (i.e. Medicaid, Medicare, [place] Health Choice, Veterans' Benefits) • Household income should be no more than 200% of the poverty level •The clinic cannot treat you if you are pregnant or think you are pregnant • Sexually transmitted diseases are not treated at the clinic.  ° ° °Dental Care: °Organization         Address  Phone  Notes  °[facility] [address], [place] (336) 641-6152 Accepts children up to age 21 who are enrolled in Medicaid or [place] Health Choice; pregnant women with a Medicaid card; and children who have applied for Medicaid or [place] Health Choice, but were declined, whose parents can pay a reduced fee at time of service.  °[facility] [place]  [address], [place] (336) 641-7733 Accepts children up to age 21 who are  enrolled in Medicaid or [place] Health Choice; pregnant women with a Medicaid card; and children who have applied for Medicaid or [place] Health Choice, but were declined, whose parents can pay a reduced fee at time of service.  °[facility] ° [address], [place] (336) 641-4533 Patients are seen by appointment only. Walk-ins are not accepted. [facility] will see patients 18 years of age and older. °Monday - Tuesday (8am-5pm) °Most Wednesdays (8:30-5pm) °$30 per visit, cash only  °[facility] ° [address], [place] (336) 641-4533 Patients are seen by appointment only. Walk-ins are not accepted. [facility] will see patients 18 years of age and older. °One Wednesday Evening (Monthly: Volunteer Based).  $30 per visit, cash only  °[facility]  (919) 537-3737 for adults; Children under age 4, call Graduate Pediatric Dentistry at ([phone]) [phone]. Children aged 4-14, please call ([phone]) [phone] to request a pediatric application. ° Dental services are provided in all areas of dental care including fillings, crowns and bridges, complete and partial dentures, implants, gum treatment, root canals, and extractions. Preventive care is also provided. Treatment is provided to both adults and children. °Patients are selected via a lottery and there is often a waiting list. °  °[facility] [address], °[place] ° (336) 763-8833 [email or web address] °  °[facility] [address], [place], [place] (336)723-1848, Ext. 123 Second and Fourth Thursday of each month, opens at 6:30 AM; Clinic ends at 9 AM.  Patients are seen on a first-come first-served basis, and a limited number are seen during each clinic.  ° °[facility] ° [address], [place], [place] (336) 723-7904   Eligibility Requirements °You must have lived in [place], [place], or [place] counties for at least the last three months. °  You  cannot be eligible for state or federal sponsored healthcare insurance, including Veterans Administration, Medicaid, or Medicare. °  You generally cannot be eligible for healthcare insurance through your employer.  °  How to apply: °Eligibility screenings are held every Tuesday and Wednesday afternoon from 1:00 pm until 4:00 pm. You do not need an appointment for the interview!  °[facility] [address], [place], [place] 336-631-2330   °[facility]  336-342-8273   °[facility]  336-703-3100   °[facility]  336-570-6415   ° °Behavioral Health Resources in the Community: °Intensive Outpatient Programs °Organization         Address  Phone  Notes  °[facility] [address], [place], [place] 336-878-6098   °[facility] [address], [place], [place] 336-832-9800   °[facility] [address], [place], [place] ° 336-882-2125   °[facility] [address],  °[place], [place] 1-800-853-5163 or 336-641-4981   °Substance Abuse Resources °Organization         Address  Phone  Notes  °[facility]  336-882-2125   °[facility]  336-784-9470   °[facility]  336-285-9073   °[facility]  336-845-3988   °Residential & Outpatient Substance Abuse Program  1-800-659-3381   °Psychological Services °Organization         Address  Phone  Notes  °[facility]  336- 832-9600   °[facility]  336- 378-7881   °[facility] [address], [place] 1-800-853-5163 or 336-641-4981   ° °Mobile Crisis Teams °Organization         Address  Phone  Notes  °[facility], Mobile Crisis Care Unit  1-877-626-1772   °Assertive °[facility] ° [address] [place], [place] 336-834-9664   °Sharon DeEsch [address] °[place] 336-554-5454   ° °Self-Help/Support  Groups °Organization         Address  Phone             Notes  °[facility] - variety of support groups  336- 373-1402 Call for more information  °Narcotics Anonymous (NA), [facility] [address], °[place]  2 meetings at this location  ° °Residential Treatment Programs °Organization         Address  Phone  Notes  °[facility] [address],    °[place]  1-866-801-8205   °[facility] ° [address], [place], [place] 704-293-8524   °[facility] [address], [place] 336-845-3988 Admissions: 8am-3pm M-F  °[facility] [address]    °[place], [place] 336-841-1104   °[facility] [address], [place], [place] 336-379-7146   °[facility] [address]  °, [place] 336-285-9073   °[facility] - Intensive Outpatient [address], , [place] 336-852-3033   °[facility] [address]  °[place], [place] 1-877-615-2722 or 336-784-9470   °[facility] [address] [place], [place] 336-227-7417 Accepts Medicaid  °[facility] [address]  ° [place] 1-800-659-3381 Substance Abuse/Addiction Treatment  ° °[place] Behavioral Health Resources °Organization         Address  Phone  Notes  °[facility]  (888) 581-9988   °Julie Brannon, PhD [address] [place], [place]   (336) 349-5553 or (336) 951-0000   °[facility]   [address] °[place], [place] (336) 349-4454   °[facility] [address], [place],  (336) 342-8316 Insurance/Medicaid/sponsorship through [facility]  °[facility] [address]                                    [place],  (336) 342-8316 Therapy/tele-psych/case  °[facility]   [address]   [place], [place] [phone]    Dr. Lolly Mustache  539-480-8169   [facility]  [facility] Dept. 1) [address] [place] 2) [address], [place] 3)  [address], [place] 540-201-6514 951-600-4238  (716)163-1396   [facility] Child Abuse Hotline 825-554-0002 or [phone] (After Hours)      DASH Eating Plan DASH stands for "Dietary Approaches to Stop Hypertension." The DASH eating plan is a healthy eating plan that has been shown to reduce high blood pressure (hypertension). Additional health benefits may include reducing the risk of type 2 diabetes mellitus, heart disease, and stroke. The DASH eating plan may also help with weight loss. WHAT DO I NEED TO KNOW ABOUT THE DASH EATING PLAN? For the DASH eating plan, you will follow these general guidelines:  Choose foods with a percent daily value for sodium of less than 5% (as listed on the food label).  Use salt-free seasonings or herbs instead of table salt or sea salt.  Check with your health care provider or pharmacist before using salt substitutes.  Eat lower-sodium products, often labeled as "lower sodium" or "no salt added."  Eat fresh foods.  Eat more vegetables, fruits, and low-fat dairy products.  Choose whole grains. Look for the word "whole" as the first word in the ingredient list.  Choose fish and skinless chicken or [place] more often than red meat. Limit fish, poultry, and meat to 6 oz (170 g) each day.  Limit sweets, desserts, sugars, and sugary drinks.  Choose heart-healthy fats.  Limit cheese to 1 oz (28 g) per day.  Eat more home-cooked food and less restaurant, buffet, and fast food.  Limit fried foods.  Cook foods using methods other than frying.  Limit canned vegetables. If you do use them, rinse them well to decrease the sodium.  When eating at a restaurant, ask that your food be prepared with less salt, or no salt if possible. WHAT FOODS CAN I EAT? Seek help from a dietitian for individual calorie  needs. Grains Whole grain or whole wheat bread. Brown rice. Whole grain or whole wheat pasta. Quinoa, bulgur, and whole grain cereals. Low-sodium cereals. Corn or whole wheat flour tortillas. Whole grain cornbread. Whole grain crackers. Low-sodium crackers. Vegetables Fresh or frozen vegetables (raw, steamed, roasted, or grilled). Low-sodium or reduced-sodium tomato and vegetable juices. Low-sodium or reduced-sodium tomato sauce and paste. Low-sodium or reduced-sodium canned vegetables.  Fruits All fresh, canned (in natural juice), or frozen fruits. Meat and Other Protein Products Ground beef (85% or leaner), grass-fed beef, or beef trimmed of fat. Skinless chicken or [place]. Ground chicken or [place]. Pork trimmed of fat. All fish and seafood. Eggs. Dried beans, peas, or lentils. Unsalted nuts and seeds. Unsalted canned beans. Dairy Low-fat dairy products, such as skim or 1% milk, 2% or reduced-fat cheeses, low-fat ricotta or cottage cheese, or plain low-fat yogurt. Low-sodium or reduced-sodium cheeses. Fats and Oils Tub margarines without trans fats. Light or reduced-fat mayonnaise and salad dressings (reduced sodium). Avocado. Safflower, olive, or canola oils. Natural peanut or almond butter. Other Unsalted popcorn and pretzels. The items listed above may not be a complete list of recommended foods or beverages. Contact your dietitian for more options. WHAT FOODS ARE NOT RECOMMENDED? Grains White bread. White pasta. White rice. Refined cornbread. Bagels and croissants. Crackers that contain trans fat. Vegetables Creamed or fried vegetables. Vegetables in  a cheese sauce. Regular canned vegetables. Regular canned tomato sauce and paste. Regular tomato and vegetable juices. Fruits Dried fruits. Canned fruit in light or heavy syrup. Fruit juice. Meat and Other Protein Products Fatty cuts of meat. Ribs, chicken wings, bacon, sausage, bologna, salami, chitterlings, fatback, hot dogs, bratwurst,  and packaged luncheon meats. Salted nuts and seeds. Canned beans with salt. Dairy Whole or 2% milk, cream, half-and-half, and cream cheese. Whole-fat or sweetened yogurt. Full-fat cheeses or blue cheese. Nondairy creamers and whipped toppings. Processed cheese, cheese spreads, or cheese curds. Condiments Onion and garlic salt, seasoned salt, table salt, and sea salt. Canned and packaged gravies. Worcestershire sauce. Tartar sauce. Barbecue sauce. Teriyaki sauce. Soy sauce, including reduced sodium. Steak sauce. Fish sauce. Oyster sauce. Cocktail sauce. Horseradish. Ketchup and mustard. Meat flavorings and tenderizers. Bouillon cubes. Hot sauce. Tabasco sauce. Marinades. Taco seasonings. Relishes. Fats and Oils Butter, stick margarine, lard, shortening, ghee, and bacon fat. Coconut, palm kernel, or palm oils. Regular salad dressings. Other Pickles and olives. Salted popcorn and pretzels. The items listed above may not be a complete list of foods and beverages to avoid. Contact your dietitian for more information. WHERE CAN I FIND MORE INFORMATION? National Heart, Lung, and Blood Institute: [employer] Document Released: 02/17/2011 Document Revised: 07/15/2013 Document Reviewed: 01/02/2013 [facility] Patient Information 2015 [place], [place]. This information is not intended to replace advice given to you by your health care provider. Make sure you discuss any questions you have with your health care provider.

## 2014-12-08 NOTE — ED Provider Notes (Signed)
CSN: [id number]     Arrival date & time 12/08/14  1331 History   First MD Initiated Contact with Patient 12/08/14 1507     Chief Complaint  Patient presents with  . Dental Pain   Laurie Holmes is a 29 y.o. female with a history of hypertension who presents to the  Emergency department complaining of intermittent dental pain for the past 2 months which is worse today. She complains of pain at her left lower molar.  She complains of 10 out of 10 dental pain today. She reports taking Advil 800 mg at 11 AM with little relief today.  Patient also reports a subjective fever today. Patient has a history of hypertension has not been taking her blood pressure medicines. She is unsure what blood pressure medicine she is taken in the past. She reports she has not taken any blood pressure medicine since she moved to the area.  The patient denies neck pain, neck stiffness, discharge summary mouth, double vision, ear discharge, ear pain, trouble swallowing, or rashes.  (Consider location/radiation/quality/duration/timing/severity/associated sxs/prior Treatment) HPI  Past Medical History  Diagnosis Date  . Hypertension   . Anxiety   . Depression    History reviewed. No pertinent past surgical history. No family history on file. Social History  Substance Use Topics  . Smoking status: Current Every Day Smoker    Types: Cigarettes  . Smokeless tobacco: Never Used  . Alcohol Use: Yes     Comment: rare   OB History    No data available     Review of Systems  Constitutional: Positive for fever (subjective ). Negative for appetite change.  HENT: Positive for dental problem. Negative for congestion, drooling, ear discharge, facial swelling, hearing loss and trouble swallowing.   Eyes: Negative for visual disturbance.  Respiratory: Negative for shortness of breath.   Cardiovascular: Negative for chest pain and palpitations.  Gastrointestinal: Negative for nausea, vomiting and abdominal pain.   Musculoskeletal: Negative for neck pain and neck stiffness.  Skin: Negative for rash.  Neurological: Positive for headaches. Negative for facial asymmetry.      Allergies  Review of patient's allergies indicates no known allergies.  Home Medications   Prior to Admission medications   Medication Sig Start Date End Date Taking? Authorizing Provider  FLUoxetine (PROZAC) 20 MG tablet Take 1 tablet (20 mg total) by mouth daily. 11/26/14   Mady Gemma, PA-C  ibuprofen (ADVIL,MOTRIN) 200 MG tablet Take 400 mg by mouth every 6 (six) hours as needed for headache, mild pain or moderate pain.    Historical Provider, MD  naproxen (NAPROSYN) 500 MG tablet Take 1 tablet (500 mg total) by mouth 2 (two) times daily with a meal. 12/08/14   Everlene Farrier, PA-C  ondansetron (ZOFRAN ODT) 4 MG disintegrating tablet Take 1 tablet (4 mg total) by mouth every 8 (eight) hours as needed for nausea. 11/26/14   Mady Gemma, PA-C  penicillin v potassium (VEETID) 500 MG tablet Take 1 tablet (500 mg total) by mouth 4 (four) times daily. 12/08/14   Everlene Farrier, PA-C   BP 155/74 mmHg  Pulse 90  Temp(Src) 99.7 F (37.6 C) (Oral)  Resp 18  Ht  (1.702 m)  Wt 260 lb (117.935 kg)  BMI 40.71 kg/m2  SpO2 100%  LMP 12/04/2014 Physical Exam  Constitutional: She is oriented to person, place, and time. She appears well-developed and well-nourished. No distress.  Non-toxic appearing.   HENT:  Head: Normocephalic and atraumatic.  Right  Ear: External ear normal.  Left Ear: External ear normal.  Mouth/Throat: Oropharynx is clear and moist. No oropharyngeal exudate.  Tenderness to left lower molar. No obvious dental abnormality. No abscess or induration.  No discharge from the mouth. No facial swelling.  Uvula is midline without edema. Soft palate rises symmetrically. No tonsillar hypertrophy or exudates. Tongue protrusion is normal.  No trismus.  Bilateral tympanic membranes are pearly-gray without  erythema or loss of landmarks.   Eyes: Conjunctivae and EOM are normal. Pupils are equal, round, and reactive to light. Right eye exhibits no discharge. Left eye exhibits no discharge.  Neck: Normal range of motion. Neck supple. No JVD present. No tracheal deviation present.  Good and full range of motion of neck. No rigidity.   Cardiovascular: Normal rate, normal heart sounds and intact distal pulses.   Pulmonary/Chest: Effort normal and breath sounds normal. No respiratory distress. She has no wheezes. She has no rales.  Abdominal: Soft. There is no tenderness. There is no guarding.  Lymphadenopathy:    She has no cervical adenopathy.  Neurological: She is alert and oriented to person, place, and time. No cranial nerve deficit. Coordination normal.  Skin: Skin is warm and dry. No rash noted. She is not diaphoretic. No erythema. No pallor.  Psychiatric: She has a normal mood and affect. Her behavior is normal.  Nursing note and vitals reviewed.   ED Course  Procedures (including critical care time) Labs Review Labs Reviewed - No data to display  Imaging Review No results found.    EKG Interpretation None      Filed Vitals:   12/08/14 1335 12/08/14 1555  BP: 175/110 155/74  Pulse: 98 90  Temp: 99.7 F (37.6 C)   TempSrc: Oral   Resp: 22 18  Height:  (1.702 m)   Weight: 260 lb (117.935 kg)   SpO2: 100% 100%     MDM   Meds given in ED:  Medications  penicillin v potassium (VEETID) 250 MG tablet (not administered)  naproxen (NAPROSYN) tablet 250 mg (250 mg Oral Given 12/08/14 1554)  penicillin v potassium (VEETID) tablet 250 mg (250 mg Oral Given 12/08/14 1611)    Discharge Medication List as of 12/08/2014  3:55 PM    START taking these medications   Details  penicillin v potassium (VEETID) 500 MG tablet Take 1 tablet (500 mg total) by mouth 4 (four) times daily., Starting 12/08/2014, Until Discontinued, Print        Final diagnoses:  Pain, dental   Patient  with left lower molar toothache intermittently for 2 months.  No gross abscess.  Exam unconcerning for Ludwig's angina or spread of infection.  Will treat with penicillin and pain medicine.  Urged patient to follow-up with dentist.   I also urged patient to follow-up with primary care provider to have her blood pressure rechecked as it is elevated here today. I advised the patient to follow-up with their primary care provider this week. I advised the patient to return to the emergency department with new or worsening symptoms or new concerns. The patient verbalized understanding and agreement with plan.      Everlene Farrier, PA-C 12/08/14 1621  Courteney Randall An, MD 12/08/14 2312

## 2015-04-14 ENCOUNTER — Emergency Department (HOSPITAL_COMMUNITY)
Admission: EM | Admit: 2015-04-14 | Discharge: 2015-04-15 | Disposition: A | Discharge status: Other Acute Inpatient Hospital | Payer: Self-pay

## 2015-04-14 ENCOUNTER — Encounter (HOSPITAL_COMMUNITY): Payer: Self-pay

## 2015-04-14 DIAGNOSIS — Z79899 Other long term (current) drug therapy: Secondary | ICD-10-CM | POA: Insufficient documentation

## 2015-04-14 DIAGNOSIS — F419 Anxiety disorder, unspecified: Secondary | ICD-10-CM | POA: Insufficient documentation

## 2015-04-14 DIAGNOSIS — Z3202 Encounter for pregnancy test, result negative: Secondary | ICD-10-CM | POA: Insufficient documentation

## 2015-04-14 DIAGNOSIS — I10 Essential (primary) hypertension: Secondary | ICD-10-CM | POA: Insufficient documentation

## 2015-04-14 DIAGNOSIS — G479 Sleep disorder, unspecified: Secondary | ICD-10-CM | POA: Insufficient documentation

## 2015-04-14 DIAGNOSIS — F17210 Nicotine dependence, cigarettes, uncomplicated: Secondary | ICD-10-CM | POA: Insufficient documentation

## 2015-04-14 DIAGNOSIS — F32A Depression, unspecified: Secondary | ICD-10-CM

## 2015-04-14 DIAGNOSIS — F329 Major depressive disorder, single episode, unspecified: Secondary | ICD-10-CM | POA: Insufficient documentation

## 2015-04-14 DIAGNOSIS — R630 Anorexia: Secondary | ICD-10-CM | POA: Insufficient documentation

## 2015-04-14 DIAGNOSIS — R45851 Suicidal ideations: Secondary | ICD-10-CM

## 2015-04-14 LAB — CBC
HEMATOCRIT: 39.8 % (ref 36.0–46.0)
Hemoglobin: 13.8 g/dL (ref 12.0–15.0)
MCH: 32.5 pg (ref 26.0–34.0)
MCHC: 34.7 g/dL (ref 30.0–36.0)
MCV: 93.6 fL (ref 78.0–100.0)
Platelets: 228 10*3/uL (ref 150–400)
RBC: 4.25 MIL/uL (ref 3.87–5.11)
RDW: 12.2 % (ref 11.5–15.5)
WBC: 6.1 10*3/uL (ref 4.0–10.5)

## 2015-04-14 LAB — RAPID URINE DRUG SCREEN, HOSP PERFORMED
Amphetamines: NOT DETECTED
BARBITURATES: NOT DETECTED
BENZODIAZEPINES: NOT DETECTED
Cocaine: NOT DETECTED
Opiates: NOT DETECTED
Tetrahydrocannabinol: NOT DETECTED

## 2015-04-14 LAB — COMPREHENSIVE METABOLIC PANEL
ALBUMIN: 3.8 g/dL (ref 3.5–5.0)
ALK PHOS: 59 U/L (ref 38–126)
ALT: 34 U/L (ref 14–54)
AST: 28 U/L (ref 15–41)
Anion gap: 10 (ref 5–15)
BILIRUBIN TOTAL: 0.6 mg/dL (ref 0.3–1.2)
BUN: 9 mg/dL (ref 6–20)
CALCIUM: 9.1 mg/dL (ref 8.9–10.3)
CO2: 27 mmol/L (ref 22–32)
Chloride: 104 mmol/L (ref 101–111)
Creatinine, Ser: 0.81 mg/dL (ref 0.44–1.00)
GFR calc Af Amer: >60 mL/min (ref >60)
GFR calc non Af Amer: >60 mL/min (ref >60)
GLUCOSE: 175 mg/dL — AB (ref 65–99)
Potassium: 3.7 mmol/L (ref 3.5–5.1)
Sodium: 141 mmol/L (ref 135–145)
TOTAL PROTEIN: 6.7 g/dL (ref 6.5–8.1)

## 2015-04-14 LAB — SALICYLATE LEVEL: Salicylate Lvl: <4 mg/dL (ref 2.8–30.0)

## 2015-04-14 LAB — POC URINE PREG, ED: PREG TEST UR: NEGATIVE

## 2015-04-14 LAB — ETHANOL: Alcohol, Ethyl (B): <5 mg/dL (ref <5)

## 2015-04-14 LAB — ACETAMINOPHEN LEVEL: Acetaminophen (Tylenol), Serum: <10 ug/mL — ABNORMAL LOW (ref 10–30)

## 2015-04-14 NOTE — ED Notes (Signed)
Pt states she has anxiety and depression and has been having thoughts of wanting to hurt herself for the past few weeks. It just keeps getting worse. Pt also uses prescription drugs and drinking more frequently. Doesn't have a plan has just been having thoughts.

## 2015-04-14 NOTE — ED Provider Notes (Signed)
CSN: [id number]     Arrival date & time 04/14/15  1844 History   First MD Initiated Contact with Patient 04/14/15 2159     Chief Complaint  Patient presents with  . Suicidal      HPI  Patient presents for evaluation of depression and suicidal thoughts. States she feels "depressed and alone". States that she had a previous [place]. States she was wheezing several drugs at the time with Xanax and alcohol was very agitated. She spent the night psychiatric facility. Upon sobriety she felt much better. However has struggled with depression for many years. States she presents here today and over that she can be given "long-term treatment". States that she quit her job because of being passive and with Jonny Ruiz. This was one month ago. She is a poor appetite and sleeps most of the day. She continues to drink daily and abuse Percocet daily anywhere from 0-10 Percocet on a daily basis for the last several years.  Past Medical History  Diagnosis Date  . Hypertension   . Anxiety   . Depression    History reviewed. No pertinent past surgical history. No family history on file. Social History  Substance Use Topics  . Smoking status: Current Every Day Smoker    Types: Cigarettes  . Smokeless tobacco: Never Used  . Alcohol Use: Yes     Comment: rare   OB History    No data available     Review of Systems  Constitutional: Negative for fever, chills, diaphoresis, appetite change and fatigue.  HENT: Negative for mouth sores, sore throat and trouble swallowing.   Eyes: Negative for visual disturbance.  Respiratory: Negative for cough, chest tightness, shortness of breath and wheezing.   Cardiovascular: Negative for chest pain.  Gastrointestinal: Negative for nausea, vomiting, abdominal pain, diarrhea and abdominal distention.  Endocrine: Negative for polydipsia, polyphagia and polyuria.  Genitourinary: Negative for dysuria, frequency and hematuria.   Musculoskeletal: Negative for gait problem.  Skin: Negative for color change, pallor and rash.  Neurological: Negative for dizziness, syncope, light-headedness and headaches.  Hematological: Does not bruise/bleed easily.  Psychiatric/Behavioral: Positive for suicidal ideas, sleep disturbance and dysphoric mood. Negative for behavioral problems and confusion.      Allergies  Review of patient's allergies indicates no known allergies.  Home Medications   Prior to Admission medications   Medication Sig Start Date End Date Taking? Authorizing Provider  diphenhydrAMINE (BENADRYL) 25 mg capsule Take 25 mg by mouth every 6 (six) hours as needed for sleep.   Yes Historical Provider, MD  DiphenhydrAMINE HCl, Sleep, (ZZZQUIL) 25 MG CAPS Take 25 mg by mouth at bedtime as needed (for sleep).   Yes Historical Provider, MD  FLUoxetine (PROZAC) 20 MG tablet Take 1 tablet (20 mg total) by mouth daily. 11/26/14  Yes Mady Gemma, PA-C  naproxen (NAPROSYN) 500 MG tablet Take 1 tablet (500 mg total) by mouth 2 (two) times daily with a meal. Patient not taking: Reported on 04/14/2015 12/08/14   Everlene Farrier, PA-C  ondansetron (ZOFRAN ODT) 4 MG disintegrating tablet Take 1 tablet (4 mg total) by mouth every 8 (eight) hours as needed for nausea. Patient not taking: Reported on 04/14/2015 11/26/14   Mady Gemma, PA-C  penicillin v potassium (VEETID) 500 MG tablet Take 1 tablet (500 mg total) by mouth 4 (four) times daily. Patient not taking: Reported on 04/14/2015 12/08/14   Everlene Farrier, PA-C   BP 145/105 mmHg  Pulse  100  Temp(Src) 99.4 F (37.4 C) (Oral)  Resp 20  SpO2 98%  LMP  (LMP Unknown) Physical Exam  Constitutional: She is oriented to person, place, and time. She appears well-developed and well-nourished. No distress.  HENT:  Head: Normocephalic.  Eyes: Conjunctivae are normal. Pupils are equal, round, and reactive to light. No scleral icterus.  Neck: Normal range of motion.  Neck supple. No thyromegaly present.  Cardiovascular: Normal rate and regular rhythm.  Exam reveals no gallop and no friction rub.   No murmur heard. Pulmonary/Chest: Effort normal and breath sounds normal. No respiratory distress. She has no wheezes. She has no rales.  Abdominal: Soft. Bowel sounds are normal. She exhibits no distension. There is no tenderness. There is no rebound.  Musculoskeletal: Normal range of motion.  Neurological: She is alert and oriented to person, place, and time.  Skin: Skin is warm and dry. No rash noted.  Psychiatric: Her behavior is normal.    ED Course  Procedures (including critical care time) Labs Review Labs Reviewed  COMPREHENSIVE METABOLIC PANEL - Abnormal; Notable for the following:    Glucose, Bld 175 (*)    All other components within normal limits  ACETAMINOPHEN LEVEL - Abnormal; Notable for the following:    Acetaminophen (Tylenol), Serum <10 (*)    All other components within normal limits  ETHANOL  SALICYLATE LEVEL  CBC  URINE RAPID DRUG SCREEN, HOSP PERFORMED  POC URINE PREG, ED    Imaging Review No results found. I have personally reviewed and evaluated these images and lab results as part of my medical decision-making.   EKG Interpretation None      MDM   Final diagnoses:  Depression  Suicidal ideation   Await TTS evaluation.    Rolland Porter, MD 04/14/15 [phone]

## 2015-04-14 NOTE — ED Notes (Signed)
Spoke w/ Jillyn Hidden in Staffing, requested sitter

## 2015-04-14 NOTE — ED Notes (Signed)
Given scrubs to change into. Security called to wand patient and Staffing called for sitter.

## 2015-04-14 NOTE — ED Notes (Signed)
Pt reports that she feels "depressed and alone.. can't stop her mind from racing.Marland Kitchen...wants long term placement for MH and SA treatment....takes meds, makes appointments...but never goes to appointments."  She last used alcohol and percocet last week.

## 2015-04-15 ENCOUNTER — Encounter (HOSPITAL_COMMUNITY): Payer: Self-pay

## 2015-04-15 ENCOUNTER — Inpatient Hospital Stay (HOSPITAL_COMMUNITY)
Admission: AD | Admit: 2015-04-15 | Discharge: 2015-04-20 | DRG: 885 | Disposition: A | Discharge status: Home / Self Care | Payer: Federal, State, Local not specified - Other | Attending: Psychiatry | Admitting: Psychiatry

## 2015-04-15 DIAGNOSIS — F411 Generalized anxiety disorder: Secondary | ICD-10-CM | POA: Diagnosis present

## 2015-04-15 DIAGNOSIS — Z818 Family history of other mental and behavioral disorders: Secondary | ICD-10-CM

## 2015-04-15 DIAGNOSIS — I10 Essential (primary) hypertension: Secondary | ICD-10-CM | POA: Diagnosis present

## 2015-04-15 DIAGNOSIS — G4710 Hypersomnia, unspecified: Secondary | ICD-10-CM | POA: Diagnosis present

## 2015-04-15 DIAGNOSIS — R45851 Suicidal ideations: Secondary | ICD-10-CM | POA: Diagnosis present

## 2015-04-15 DIAGNOSIS — F410 Panic disorder [episodic paroxysmal anxiety] without agoraphobia: Secondary | ICD-10-CM | POA: Diagnosis present

## 2015-04-15 DIAGNOSIS — F332 Major depressive disorder, recurrent severe without psychotic features: Principal | ICD-10-CM | POA: Diagnosis present

## 2015-04-15 DIAGNOSIS — F17210 Nicotine dependence, cigarettes, uncomplicated: Secondary | ICD-10-CM | POA: Diagnosis present

## 2015-04-15 DIAGNOSIS — F1120 Opioid dependence, uncomplicated: Secondary | ICD-10-CM | POA: Diagnosis present

## 2015-04-15 MED ORDER — MAGNESIUM HYDROXIDE 400 MG/5ML PO SUSP: 30.0000 mL | Freq: Every day | ORAL | Status: DC | PRN

## 2015-04-15 MED ORDER — FLUOXETINE HCL 20 MG PO TABS
20.0000 mg | ORAL_TABLET | Freq: Every day | ORAL | Status: DC
  Administered 2015-04-15 – 2015-04-17 (×3): 20 mg via ORAL
  Filled 2015-04-15 (×5): qty 1

## 2015-04-15 MED ORDER — ACETAMINOPHEN 325 MG PO TABS
650.0000 mg | ORAL_TABLET | Freq: Four times a day (QID) | ORAL | Status: DC | PRN
  Administered 2015-04-16 – 2015-04-19 (×4): 650 mg via ORAL
  Filled 2015-04-15 (×4): qty 2

## 2015-04-15 MED ORDER — DIPHENHYDRAMINE HCL 25 MG PO CAPS
50.0000 mg | ORAL_CAPSULE | Freq: Once | ORAL | Status: AC
  Administered 2015-04-15: 50 mg via ORAL
  Filled 2015-04-15: qty 2

## 2015-04-15 MED ORDER — HYDROXYZINE HCL 50 MG PO TABS
50.0000 mg | ORAL_TABLET | Freq: Every evening | ORAL | Status: DC | PRN
  Administered 2015-04-15 – 2015-04-19 (×8): 50 mg via ORAL
  Filled 2015-04-15 (×17): qty 1

## 2015-04-15 MED ORDER — ALUM & MAG HYDROXIDE-SIMETH 200-200-20 MG/5ML PO SUSP: 30.0000 mL | ORAL | Status: DC | PRN

## 2015-04-15 NOTE — ED Notes (Signed)
Notified Non-Emergency to send [employer] Officer

## 2015-04-15 NOTE — ED Provider Notes (Signed)
Pt remains medically stable.  Accepted [facility].  Plan on transfer for inpatient psychiatric care.  Linwood Dibbles, MD 04/15/15 [phone]

## 2015-04-15 NOTE — Progress Notes (Signed)
Laurie Holmes states she had a good day. She went to group and was able to interact with her peers on the unit. She did not have a goal for today. She rates anxiety and depression 7/1 Hopelessness 5/10. She denies SI/HI/AVH. Contracts for safety. Encouragement and support given. Medications administered as prescribed. Continue to monitor patient Q 15 minutes for safety and medication effectiveness.

## 2015-04-15 NOTE — ED Notes (Signed)
TTS in process 

## 2015-04-15 NOTE — [facility] Counselor (Signed)
Psych disposition: Per Karleen Hampshire, PA-C, Pt meets inpt tx criteria for dual dx treatment. No 300-hall beds currently available at [facility]. TTS to seek placement at appropriate facilities.  *However, Pt is requesting long-term/residential treatment that will address both her MH and SA issues.   Cyndie Mull, [facility]   Therapeutic Triage

## 2015-04-15 NOTE — Progress Notes (Signed)
Pt attend NA group meeting this evening.

## 2015-04-15 NOTE — ED Notes (Signed)
Pt served w/ IVC, no distress noted at this time.

## 2015-04-15 NOTE — Progress Notes (Signed)
Pt accepted to [facility] bed 306-2 by Dr Dub Mikes. Pt is under IVC. Report can be called at [phone], and pt can arrive after 14:00 today.  Ilean Skill, MSW, LCSW Clinical Social Work, Disposition  04/15/2015 [phone]

## 2015-04-15 NOTE — Tx Team (Signed)
Initial Interdisciplinary Treatment Plan   PATIENT STRESSORS: Financial difficulties Substance abuse   PATIENT STRENGTHS: Ability for insight Capable of independent living   PROBLEM LIST: Problem List/Patient Goals Date to be addressed Date deferred Reason deferred Estimated date of resolution  "long-term tx"      "make sure prozac is the right med"                                                  DISCHARGE CRITERIA:  Ability to meet basic life and health needs Adequate post-discharge living arrangements  PRELIMINARY DISCHARGE PLAN: Attend aftercare/continuing care group Attend PHP/IOP  PATIENT/FAMIILY INVOLVEMENT: This treatment plan has been presented to and reviewed with the patient, Laurie Holmes, and/or family member.  The patient and family have been given the opportunity to ask questions and make suggestions.  Laurie Holmes L 04/15/2015, 6:49 PM

## 2015-04-15 NOTE — ED Notes (Signed)
A regular diet ordered for lunch. 
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
## 2015-04-16 ENCOUNTER — Encounter (HOSPITAL_COMMUNITY): Payer: Self-pay | Admitting: Psychiatry

## 2015-04-16 DIAGNOSIS — R45851 Suicidal ideations: Secondary | ICD-10-CM

## 2015-04-16 DIAGNOSIS — F1120 Opioid dependence, uncomplicated: Secondary | ICD-10-CM

## 2015-04-16 DIAGNOSIS — F332 Major depressive disorder, recurrent severe without psychotic features: Principal | ICD-10-CM

## 2015-04-16 MED ORDER — NICOTINE 21 MG/24HR TD PT24
21.0000 mg | MEDICATED_PATCH | Freq: Every day | TRANSDERMAL | Status: DC
  Administered 2015-04-18 – 2015-04-20 (×3): 21 mg via TRANSDERMAL
  Filled 2015-04-16 (×8): qty 1

## 2015-04-16 MED ORDER — HYDROXYZINE HCL 25 MG PO TABS
25.0000 mg | ORAL_TABLET | Freq: Four times a day (QID) | ORAL | Status: DC | PRN
  Administered 2015-04-16 – 2015-04-19 (×3): 25 mg via ORAL
  Filled 2015-04-16 (×3): qty 1

## 2015-04-16 MED ORDER — LAMOTRIGINE 25 MG PO TABS
25.0000 mg | ORAL_TABLET | Freq: Every day | ORAL | Status: DC
  Administered 2015-04-16 – 2015-04-20 (×5): 25 mg via ORAL
  Filled 2015-04-16: qty 1
  Filled 2015-04-16: qty 7
  Filled 2015-04-16 (×6): qty 1

## 2015-04-16 MED ORDER — LOPERAMIDE HCL 2 MG PO CAPS: 2.0000 mg | ORAL_CAPSULE | ORAL | Status: DC | PRN

## 2015-04-16 MED ORDER — LISINOPRIL 10 MG PO TABS
10.0000 mg | ORAL_TABLET | Freq: Every day | ORAL | Status: DC
Start: 2015-04-17 — End: 2015-04-21
  Administered 2015-04-17 – 2015-04-20 (×4): 10 mg via ORAL
  Filled 2015-04-16 (×5): qty 1

## 2015-04-16 MED ORDER — MIRTAZAPINE 7.5 MG PO TABS
7.5000 mg | ORAL_TABLET | Freq: Every day | ORAL | Status: DC
  Administered 2015-04-16 – 2015-04-19 (×4): 7.5 mg via ORAL
  Filled 2015-04-16: qty 1
  Filled 2015-04-16: qty 7
  Filled 2015-04-16 (×4): qty 1

## 2015-04-16 MED ORDER — LISINOPRIL 20 MG PO TABS
20.0000 mg | ORAL_TABLET | Freq: Once | ORAL | Status: AC
  Administered 2015-04-16: 20 mg via ORAL
  Filled 2015-04-16 (×2): qty 1

## 2015-04-16 MED ORDER — ONDANSETRON 4 MG PO TBDP: 4.0000 mg | ORAL_TABLET | Freq: Three times a day (TID) | ORAL | Status: DC | PRN

## 2015-04-16 NOTE — [facility] Suicide Risk Assessment (Signed)
[facility] INPATIENT:  Family/Significant Other Suicide Prevention Education  Suicide Prevention Education:  Patient Refusal for Family/Significant Other Suicide Prevention Education: The patient Laurie Holmes has refused to provide written consent for family/significant other to be provided Family/Significant Other Suicide Prevention Education during admission and/or prior to discharge.  Physician notified.  SPE completed with pt, as pt refused to consent to family contact. SPI pamphlet provided to pt and pt was encouraged to share information with support network, ask questions, and talk about any concerns relating to SPE. Pt denies access to guns/firearms and verbalized understanding of information provided. Mobile Crisis information also provided to pt.   Smart, Montie Gelardi LCSW 04/16/2015, 2:33 PM

## 2015-04-16 NOTE — Progress Notes (Addendum)
D: Pt was in the day room upon initial approach.  Pt presents with appropriate affect and depressed mood.  She reports her day has "been good."  Pt reports she accomplished her goal today, which was "to attend the meetings and programs."  Pt smiles frequently and interacts appropriately with staff and peers.  She attended evening group tonight.  Pt denies SI/HI, denies hallucinations, denies pain.      A: Introduced self to pt.  Medications administered per order.  Support and encouragement offered.  Actively listened to pt.    R: Pt is compliant with medications.  She verbally contracts for safety and reports that she will inform staff of needs and concerns.  Will continue to monitor and assess.
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
## 2015-04-16 NOTE — Progress Notes (Signed)
D:  Patient's self inventory sheet, patient has poor sleep, sleep medication not helpful.  Good appetite, normal energy level, good concentration.  Rated depression 7, hopeless and anxiety 5.  Denied withdrawals.  Denied SI.  Denied physical problems.  Goal is to be proactive and go to all groups and meetings that are beneficial to me.  Plans to be present.  Did not sleep well last night and had twitches throughout the night.  Denied discharge. A:   Medications administered per MD orders.  Emotional support and encouragement given patient. R:  Denied SI and HI, contracts for safety.  Denied A/V hallucinations.  Safety maintained with 15 minute checks.

## 2015-04-16 NOTE — Plan of Care (Signed)
Problem: Consults Goal: Depression Patient Education See Patient Education Module for education specifics.  Outcome: Progressing Nurse discussed depression/coping skills with patient.        

## 2015-04-16 NOTE — [facility] Group Notes (Addendum)
[facility] LCSW Group Therapy 04/16/2015 1:15 PM Type of Therapy: Group Therapy Participation Level: Active  Participation Quality: Attentive, Sharing and Supportive  Affect: Depressed and Flat  Cognitive: Alert and Oriented  Insight: Developing/Improving and Engaged  Engagement in Therapy: Developing/Improving and Engaged  Modes of Intervention: Activity, Clarification, Confrontation, Discussion, Education, Exploration, Limit-setting, Orientation, Problem-solving, Rapport Building, Reality Testing, Socialization and Support  Summary of Progress/Problems: Patient was attentive and engaged with speaker from [employer]. Patient was attentive to speaker while they shared their story of dealing with mental health and overcoming it. Patient expressed interest in their programs and services and received information on their agency. Patient processed ways they can relate to the speaker.   Zenon Leaf, LCSW Clinical Social Worker [facility] [phone]   
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
## 2015-04-16 NOTE — Tx Team (Signed)
Interdisciplinary Treatment Plan Update (Adult)  Date:  04/16/2015  Time Reviewed:  11:29 AM   Progress in Treatment: Attending groups: No. New to unit. Continuing to assess.  Participating in groups:  No. Taking medication as prescribed:  Yes. Tolerating medication:  Yes. Family/Significant othe contact made:  SPE required for this pt.  Patient understands diagnosis:  Yes. and As evidenced by:  seeking treatment for:  Discussing patient identified problems/goals with staff:  Yes. Medical problems stabilized or resolved:  Yes. Denies suicidal/homicidal ideation: Yes. Issues/concerns per patient self-inventory:  Other:  Discharge Plan or Barriers: CSW assessing for appropriate referrals. Pt currently goes to [facility] for outpatient mental health services.   Reason for Continuation of Hospitalization: Depression Medication stabilization Withdrawal symptoms  Comments:  Laurie Holmes is a single, 30 y.o. female reporting worsening depression, anxiety, and substance abuse. She recently moved to the [place] area 6-7 months ago from [place] to be closer to family. However, the pt reports that she doesn't feel like her family understands her depression or her struggle with drug addiction. She says that her sisters have said things like "just snap out of it" and frequently treat her like she is a "crack head" or a "junkie" because she abuses narcotic pain killers. She reports having very little social or familial support and says that she's been feeling suicidal for the past 3-4 weeks. The pt also reports being "scared to be alone" and experiencing thoughts of wanting to harm herself for the past few weeks. Pt states, "I cry myself to sleep every night and think about wanting to die". She denies any intent to act on these thoughts but is still fearful that something could happen unintentionally due to her impulsivity and drug use. Pt reports that her depression has gotten so bad, she even  quit her job because she lacked the motivation to get out of bed in the morning and go to work.  Pt reports feeling physically and emotionally "exhausted". She has experienced several major stressors within the last year, including moving away from her support system in [place], quitting her job, and starting over with a new mental health provider. She has one prior suicide attempt "many years ago" via cutting her wrists. She has 1 prior psychiatric hospitalization in [place] as well, at one of their inpt facilities. There is a family hx of mental illness and SA, especially etoh addiction. The pt denies HI, A/VH, current self-harm, violent behaviors, legal issues, and panic attacks. She does endorse anxiety but says it is a more generalized anxiety, involving trouble sleeping, excessive worrying, and rumination over wrong-doings from her past. Pt indicates that she currently uses opioids on a daily basis and etoh on a less regular basis; she uses about ten 10 mg Percocet pills per day and drinks 1-2 glasses of wine or beer a few times per week.  Pt reiterates that her drug of choice is opioid pain pills, and she denies ever progressing to heroin or any "harder" opiates. She also denies any current withdrawal sx but admits that she does get "sick" if she goes too long without pills. The pt is currently receiving med management from [facility]; she reports currently taking her psychiatric meds as prescribed. The pt believes that she needs some type of "long term" treatment that will address both her mental health and substance abuse needs, as she feels that she is using drugs and etoh in an attempt to self-medicate her depression and anxiety.  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   Additional  Comments:  Patient and CSW reviewed pt's identified goals and treatment plan. Patient verbalized understanding and agreed to treatment plan. CSW reviewed [facility] "Discharge Process and Patient Involvement" Form. Pt verbalized understanding of information provided and signed form.    Review of initial/current patient goals per problem list:  1. Goal(s): Patient will participate in aftercare plan  Met: no.   Target date: at discharge  As evidenced by: Patient will participate within aftercare plan AEB aftercare provider and housing plan at discharge being identified.  2/2: CSW assessing for appropriate referrals.   2. Goal (s): Patient will exhibit decreased depressive symptoms and suicidal ideations.  Met: No.    Target date: at discharge  As evidenced by: Patient will utilize self rating of depression at 3 or below and demonstrate decreased signs of depression or be deemed stable for discharge by MD.  2/2: Pt rates depression as high today. Denies SI/HI/AVH.   3. Goal(s): Patient will demonstrate decreased signs of withdrawal due to substance abuse  Met:No.   Target date:at discharge   As evidenced by: Patient will produce a CIWA/COWS score of 0, have stable vitals signs, and no symptoms of withdrawal.  2/2: Pt reports minimal signs of withdrawal with CIWA score of 0/COWS of 1 and high BP.   Attendees: Patient:   04/16/2015 11:29 AM   Family:   04/16/2015 11:29 AM   Physician:  Dr. Carlton Adam, MD 04/16/2015 11:29 AM   Nursing:   Albertina Parr RN 04/16/2015 11:29 AM   Clinical Social Worker: Maxie Better, LCSW 04/16/2015 11:29 AM   Clinical Social Worker: Erasmo Downer Drinkard LCSWA; Peri Maris LCSWA 04/16/2015 11:29 AM   Other:  Gerline Legacy Nurse Case Manager 04/16/2015 11:29 AM   Other: 04/16/2015 11:29 AM   Other:   04/16/2015 11:29 AM   Other:  04/16/2015 11:29 AM   Other:  04/16/2015 11:29 AM   Other:  04/16/2015 11:29 AM    04/16/2015 11:29 AM    04/16/2015 11:29 AM     04/16/2015 11:29 AM    04/16/2015 11:29 AM    Scribe for Treatment Team:   Maxie Better, LCSW 04/16/2015 11:29 AM

## 2015-04-16 NOTE — Plan of Care (Signed)
Problem: Alteration in mood & ability to function due to Goal: STG-Patient will attend groups Outcome: Progressing Pt attended evening group on 04/16/15.  She reports she attended all groups throughout the day.

## 2015-04-16 NOTE — BHH Group Notes (Signed)
The focus of this group is to educate the patient on the purpose and policies of crisis stabilization and provide a format to answer questions about their admission.  The group details unit policies and expectations of patients while admitted.  Patient did not attend 0900 nurse education orientation group this morning.  Patient stayed in bed.   

## 2015-04-16 NOTE — Progress Notes (Signed)
Patient did attend the evening karaoke group.  

## 2015-04-16 NOTE — BHH Suicide Risk Assessment (Signed)
[facility] Admission Suicide Risk Assessment   Nursing information obtained from:    Demographic factors:    Current Mental Status:    Loss Factors:    Historical Factors:    Risk Reduction Factors:     Total Time spent with patient: 45 minutes Principal Problem: MDD (major depressive disorder), recurrent severe, without psychosis (HCC) Diagnosis:   Patient Active Problem List   Diagnosis Date Noted  . Opioid type dependence, continuous (HCC) [F11.20] 04/16/2015  . MDD (major depressive disorder), recurrent severe, without psychosis (HCC) [F33.2] 04/15/2015  . Anxiety [F41.9]    Subjective Data: see admission H and P  Continued Clinical Symptoms:  Alcohol Use Disorder Identification Test Final Score (AUDIT): 24 The "Alcohol Use Disorders Identification Test", Guidelines for Use in Primary Care, Second Edition.  World Science writer [facility]). Score between 0-7:  no or low risk or alcohol related problems. Score between 8-15:  moderate risk of alcohol related problems. Score between 16-19:  high risk of alcohol related problems. Score 20 or above:  warrants further diagnostic evaluation for alcohol dependence and treatment.   CLINICAL FACTORS:   Depression:   Comorbid alcohol abuse/dependence Alcohol/Substance Abuse/Dependencies   Psychiatric Specialty Exam: ROS  Blood pressure 162/104, pulse 87, temperature 97.9 F (36.6 C), temperature source Oral, resp. rate 18, height  (1.702 m), weight 117.482 kg (259 lb), SpO2 100 %.Body mass index is 40.56 kg/(m^2).   COGNITIVE FEATURES THAT CONTRIBUTE TO RISK:  Closed-mindedness, Polarized thinking and Thought constriction (tunnel vision)    SUICIDE RISK:   Moderate:  Frequent suicidal ideation with limited intensity, and duration, some specificity in terms of plans, no associated intent, good self-control, limited dysphoria/symptomatology, some risk factors present, and identifiable protective factors, including available and  accessible social support.  PLAN OF CARE: see admission H and P  I certify that inpatient services furnished can reasonably be expected to improve the patient's condition.   Rachael Fee, MD 04/16/2015, 2:08 PM

## 2015-04-17 MED ORDER — FLUOXETINE HCL 10 MG PO CAPS
30.0000 mg | ORAL_CAPSULE | Freq: Every day | ORAL | Status: DC
  Administered 2015-04-18 – 2015-04-20 (×3): 30 mg via ORAL
  Filled 2015-04-17: qty 3
  Filled 2015-04-17: qty 21
  Filled 2015-04-17 (×3): qty 3

## 2015-04-17 MED ORDER — FLUOXETINE HCL 10 MG PO CAPS
10.0000 mg | ORAL_CAPSULE | Freq: Every day | ORAL | Status: AC
  Administered 2015-04-17: 10 mg via ORAL
  Filled 2015-04-17 (×2): qty 1

## 2015-04-17 NOTE — BHH Group Notes (Signed)
[facility] LCSW Aftercare Discharge Planning Group Note   04/17/2015 10:37 AM  Participation Quality:  Appropriate   Mood/Affect:  Appropriate  Depression Rating:  4  Anxiety Rating:  4  Thoughts of Suicide:  No Will you contract for safety?   NA  Current AVH:  No  Plan for Discharge/Comments:  [facility]. CSW assessing. [facility] for medication management. No withdrawals today. Pt has concerns about "shakiness" and believes it may be due to mood stabilizer. MD made aware.   Transportation Means: unknown at this time   Supports: sisters  Smart, Research scientist (physical sciences) LCSW

## 2015-04-17 NOTE — Plan of Care (Signed)
Problem: Alteration in mood & ability to function due to Goal: LTG-Pt reports reduction in suicidal thoughts (Patient reports reduction in suicidal thoughts and is able to verbalize a safety plan for whenever patient is feeling suicidal)  Outcome: Progressing Reports no SI today and states mood improving.

## 2015-04-17 NOTE — Progress Notes (Signed)
[facility] MD Progress Note  04/17/2015 2:23 PM Laurie Holmes  MRN:  [id number] Subjective:  Laurie Holmes states that she experienced more anxiety-restlessness after she took the Lamictal. She also admits that she has been hair pulling for the last 3 months. She has not done anything like that or exhibited any other compulsive behaviors before. She also endorses persistent depression Principal Problem: MDD (major depressive disorder), recurrent severe, without psychosis (HCC) Diagnosis:   Patient Active Problem List   Diagnosis Date Noted  . Opioid type dependence, continuous (HCC) [F11.20] 04/16/2015  . MDD (major depressive disorder), recurrent severe, without psychosis (HCC) [F33.2] 04/15/2015  . Anxiety [F41.9]    Total Time spent with patient: 20 minutes  Past Psychiatric History: see admission H and P  Past Medical History:  Past Medical History  Diagnosis Date  . Hypertension   . Anxiety   . Depression    History reviewed. No pertinent past surgical history. Family History: History reviewed. No pertinent family history. Family Psychiatric  History: see admission H and P Social History:  History  Alcohol Use  . Yes    Comment: rare     History  Drug Use  . Yes    Social History   Social History  . Marital Status: Single    Spouse Name: N/A  . Number of Children: N/A  . Years of Education: N/A   Social History Main Topics  . Smoking status: Current Every Day Smoker    Types: Cigarettes  . Smokeless tobacco: Never Used  . Alcohol Use: Yes     Comment: rare  . Drug Use: Yes  . Sexual Activity: Not Asked     Comment: Rx drugs   Other Topics Concern  . None   Social History Narrative   Additional Social History:                         Sleep: Fair  Appetite:  Fair  Current Medications: Current Facility-Administered Medications  Medication Dose Route Frequency Provider Last Rate Last Dose  . acetaminophen (TYLENOL) tablet 650 mg  650 mg Oral Q6H  PRN Beau Fanny, FNP   650 mg at 04/16/15 1600  . alum & mag hydroxide-simeth (MAALOX/MYLANTA) 200-200-20 MG/5ML suspension 30 mL  30 mL Oral Q4H PRN Beau Fanny, FNP      . [START ON 04/18/2015] FLUoxetine (PROZAC) capsule 30 mg  30 mg Oral Daily Rachael Fee, MD      . hydrOXYzine (ATARAX/VISTARIL) tablet 25 mg  25 mg Oral Q6H PRN Adonis Brook, NP   25 mg at 04/16/15 1814  . hydrOXYzine (ATARAX/VISTARIL) tablet 50 mg  50 mg Oral QHS,MR X 1 Kerry Hough, PA-C   50 mg at 04/16/15 2140  . lamoTRIgine (LAMICTAL) tablet 25 mg  25 mg Oral Daily Rachael Fee, MD   25 mg at 04/17/15 4098  . lisinopril (PRINIVIL,ZESTRIL) tablet 10 mg  10 mg Oral Daily Sanjuana Kava, NP   10 mg at 04/17/15 1191  . loperamide (IMODIUM) capsule 2 mg  2 mg Oral PRN Rachael Fee, MD      . magnesium hydroxide (MILK OF MAGNESIA) suspension 30 mL  30 mL Oral Daily PRN Beau Fanny, FNP      . mirtazapine (REMERON) tablet 7.5 mg  7.5 mg Oral QHS Rachael Fee, MD   7.5 mg at 04/16/15 2141  . nicotine (NICODERM CQ - dosed in mg/24 hours)  patch 21 mg  21 mg Transdermal Daily Rachael Fee, MD   21 mg at 04/16/15 1430  . ondansetron (ZOFRAN-ODT) disintegrating tablet 4 mg  4 mg Oral Q8H PRN Rachael Fee, MD        Lab Results: No results found for this or any previous visit (from the past 48 hour(s)).  Physical Findings: AIMS: Facial and Oral Movements Muscles of Facial Expression: None, normal Lips and Perioral Area: None, normal Jaw: None, normal Tongue: None, normal,Extremity Movements Upper (arms, wrists, hands, fingers): None, normal Lower (legs, knees, ankles, toes): None, normal, Trunk Movements Neck, shoulders, hips: None, normal, Overall Severity Severity of abnormal movements (highest score from questions above): None, normal Incapacitation due to abnormal movements: None, normal Patient's awareness of abnormal movements (rate only patient's report): No Awareness, Dental Status Current problems  with teeth and/or dentures?: No Does patient usually wear dentures?: No  CIWA:  CIWA-Ar Total: 1 COWS:  COWS Total Score: 1  Musculoskeletal: Strength & Muscle Tone: within normal limits Gait & Station: normal Patient leans: normal  Psychiatric Specialty Exam: Review of Systems  Constitutional: Positive for malaise/fatigue.  HENT: Negative.   Eyes: Negative.   Respiratory: Negative.   Cardiovascular: Negative.   Gastrointestinal: Negative.   Genitourinary: Negative.   Musculoskeletal: Negative.   Skin: Negative.   Neurological: Negative.   Endo/Heme/Allergies: Negative.   Psychiatric/Behavioral: Positive for depression and substance abuse. The patient is nervous/anxious.     Blood pressure 157/106, pulse 89, temperature 97.7 F (36.5 C), temperature source Oral, resp. rate 20, height  (1.702 m), weight 117.482 kg (259 lb), SpO2 100 %.Body mass index is 40.56 kg/(m^2).  General Appearance: Fairly Groomed  Patent attorney::  Fair  Speech:  Clear and Coherent  Volume:  fluctuates  Mood:  Anxious, Depressed and Dysphoric  Affect:  Restricted  Thought Process:  Coherent and Goal Directed  Orientation:  Full (Time, Place, and Person)  Thought Content:  symptoms events worries concerns  Suicidal Thoughts:  No  Homicidal Thoughts:  No  Memory:  Immediate;   Fair Recent;   Fair Remote;   Fair  Judgement:  Fair  Insight:  Present  Psychomotor Activity:  Restlessness  Concentration:  Fair  Recall:  [employer] of Knowledge:Fair  Language: Fair  Akathisia:  No  Handed:  Right  AIMS (if indicated):     Assets:  Desire for Improvement  ADL's:  Intact  Cognition: WNL  Sleep:  Number of Hours: 6.25   Treatment Plan Summary: Daily contact with patient to assess and evaluate symptoms and progress in treatment and Medication management Supportive approach/coping skills Opioid dependence; continue to work a relapse prevention plan Depression/compulive behavior ( hair pulling )  will increase the Prozac to 30 mg Mood instability; will continue the Lamictal and reassess tolerability. She is willing to give a longer try to see if she develops tolerance to that side effect Work with CBT/mindfulness Explore residential treatment options [name] A, MD 04/17/2015, 2:23 PM

## 2015-04-17 NOTE — Progress Notes (Signed)
The patient attended group and was appropriate.  

## 2015-04-17 NOTE — Progress Notes (Signed)
Recreation Therapy Notes  Date: 02.03.2017 Time: 9:30am Location: [address] Group Room   Group Topic: Stress Management  Goal Area(s) Addresses:  Patient will actively participate in stress management techniques presented during session.   Behavioral Response: Did not attend.   Shunta Mclaurin L Ariyonna Twichell, LRT/CTRS        Kalani Sthilaire L 04/17/2015 3:36 PM 

## 2015-04-17 NOTE — Progress Notes (Signed)
Patient ID: Laurie Holmes, female   DOB: 1985-04-05, 30 y.o.   MRN: [id number]  D: Patient pleasant on approach today. Reports mood improving from admission. Reports depression "4" and anxiety "5". Doesn't feel hopeless thus far today. Contracts for safety on the unit. Reports sleeping well when taking medications. Appetite good.  A: Staff will monitor on q 15 minute checks, follow treatment plan, and give meds as ordered. R: Cooperative on the unit.

## 2015-04-17 NOTE — Progress Notes (Signed)
D.  Pt pleasant on approach, denies complaints at this time.  Positive for evening AA group, interacting appropriately with peers on the unit.  Denies SI/HI/hallucinations at this time.  A.  Support and encouragement offered  R.  Pt remains safe on the unit, will continue to monitor.   

## 2015-04-18 NOTE — BHH Group Notes (Signed)
[facility] Group Notes:  (Nursing/MHT/Case Management/Adjunct)  Date:  04/18/2015  Time:  0845 am  Type of Therapy:  Psychoeducational Skills  Participation Level:  Did Not Attend  Patient invited; declined to attend.  Cranford Mon 04/18/2015, 9:17 AM

## 2015-04-18 NOTE — Progress Notes (Signed)
Patient did attend the evening speaker AA meeting.  

## 2015-04-18 NOTE — BHH Group Notes (Signed)
B[facility] Group Notes:  (Clinical Social Work)   01/10/2015     10:00-11:00AM  Summary of Progress/Problems:   In today's process group a Chain Analysis exercise on 2 patients who volunteered was done.  We processed what happened in the most recent relapse, discussed possible points for intervention and made plans for how to produce a different outcome in the future.   Motivational Interviewing and the whiteboard were utilized for the exercises.  The patient expressed herself extremely well in group and was one of the volunteers for whom we did a chain analysis of her last relapse.  She identified ruminations about perceived slights from other people as one of the main contributors to stress in her life.  She acknowledged that using opiates led to her losing her job because she just quit going to work, and now she is facing eviction.  Type of Therapy:  Group Therapy - Process   Participation Level:  Active  Participation Quality:  Attentive and Sharing  Affect:  Blunted  Cognitive:  Appropriate and Oriented  Insight:  Engaged  Engagement in Therapy:  Engaged  Modes of Intervention:  Education, Motivational Interviewing  Ambrose Mantle, LCSW 04/18/2015, 12:34 PM

## 2015-04-18 NOTE — Progress Notes (Signed)
Patient ID: Laurie [facility], female   DOB: 10/29/1985, 30 y.o.   MRN: [id number] [facility] MD Progress Note  04/18/2015 4:02 PM [name]  MRN:  [id number]  Subjective:  [name] reports, "My depression is kind of improving, but I'm still feeling very tired. I'm no longer feeling suicidal, rather worrying that [facility] may not accept me after the screening on Tuesday. My friend visited me last evening with a letter she (my friend) wrote to me. She says if it happens that I could not get into [facility] after discharge, she will not be able to accommodate in her house because she will not be able to handle me as I'm now. This letter really bothered me. It was not encouraging at all ". [name] is instructed & encouraged to focus on her effort to beat drug addiction & the effort she making to stay sober. She appears depressed. Denies SIHI, AVH.  Principal Problem: MDD (major depressive disorder), recurrent severe, without psychosis (HCC)  Diagnosis:   Patient Active Problem List   Diagnosis Date Noted  . Opioid type dependence, continuous (HCC) [F11.20] 04/16/2015  . MDD (major depressive disorder), recurrent severe, without psychosis (HCC) [F33.2] 04/15/2015  . Anxiety [F41.9]    Total Time spent with patient: 15 minutes  Past Psychiatric History: See admission H&P  Past Medical History:  Past Medical History  Diagnosis Date  . Hypertension   . Anxiety   . Depression    History reviewed. No pertinent past surgical history.  Family History: History reviewed. No pertinent family history.  Family Psychiatric  History: See admission H&P  Social History:  History  Alcohol Use  . Yes    Comment: rare     History  Drug Use  . Yes    Social History   Social History  . Marital Status: Single    Spouse Name: N/A  . Number of Children: N/A  . Years of Education: N/A   Social History Main Topics  . Smoking status: Current Every Day Smoker    Types:  Cigarettes  . Smokeless tobacco: Never Used  . Alcohol Use: Yes     Comment: rare  . Drug Use: Yes  . Sexual Activity: Not Asked     Comment: Rx drugs   Other Topics Concern  . None   Social History Narrative   Additional Social History:   Sleep: Fair  Appetite:  Fair  Current Medications: Current Facility-Administered Medications  Medication Dose Route Frequency Provider Last Rate Last Dose  . acetaminophen (TYLENOL) tablet 650 mg  650 mg Oral Q6H PRN Beau Fanny, FNP   650 mg at 04/18/15 1254  . alum & mag hydroxide-simeth (MAALOX/MYLANTA) 200-200-20 MG/5ML suspension 30 mL  30 mL Oral Q4H PRN Beau Fanny, FNP      . FLUoxetine (PROZAC) capsule 30 mg  30 mg Oral Daily Rachael Fee, MD   30 mg at 04/18/15 0865  . hydrOXYzine (ATARAX/VISTARIL) tablet 25 mg  25 mg Oral Q6H PRN Adonis Brook, NP   25 mg at 04/17/15 1818  . hydrOXYzine (ATARAX/VISTARIL) tablet 50 mg  50 mg Oral QHS,MR X 1 Kerry Hough, PA-C   50 mg at 04/17/15 2214  . lamoTRIgine (LAMICTAL) tablet 25 mg  25 mg Oral Daily Rachael Fee, MD   25 mg at 04/18/15 7846  . lisinopril (PRINIVIL,ZESTRIL) tablet 10 mg  10 mg Oral Daily Sanjuana Kava, NP   10 mg at 04/18/15 0807  .  loperamide (IMODIUM) capsule 2 mg  2 mg Oral PRN Rachael Fee, MD      . magnesium hydroxide (MILK OF MAGNESIA) suspension 30 mL  30 mL Oral Daily PRN Beau Fanny, FNP      . mirtazapine (REMERON) tablet 7.5 mg  7.5 mg Oral QHS Rachael Fee, MD   7.5 mg at 04/17/15 2214  . nicotine (NICODERM CQ - dosed in mg/24 hours) patch 21 mg  21 mg Transdermal Daily Rachael Fee, MD   21 mg at 04/18/15 0807  . ondansetron (ZOFRAN-ODT) disintegrating tablet 4 mg  4 mg Oral Q8H PRN Rachael Fee, MD       Lab Results: No results found for this or any previous visit (from the past 48 hour(s)).  Physical Findings: AIMS: Facial and Oral Movements Muscles of Facial Expression: None, normal Lips and Perioral Area: None, normal Jaw: None,  normal Tongue: None, normal,Extremity Movements Upper (arms, wrists, hands, fingers): None, normal Lower (legs, knees, ankles, toes): None, normal, Trunk Movements Neck, shoulders, hips: None, normal, Overall Severity Severity of abnormal movements (highest score from questions above): None, normal Incapacitation due to abnormal movements: None, normal Patient's awareness of abnormal movements (rate only patient's report): No Awareness, Dental Status Current problems with teeth and/or dentures?: No Does patient usually wear dentures?: No  CIWA:  CIWA-Ar Total: 0 COWS:  COWS Total Score: 1  Musculoskeletal: Strength & Muscle Tone: within normal limits Gait & Station: normal Patient leans: normal  Psychiatric Specialty Exam: Review of Systems  Constitutional: Positive for malaise/fatigue.  HENT: Negative.   Eyes: Negative.   Respiratory: Negative.   Cardiovascular: Negative.   Gastrointestinal: Negative.   Genitourinary: Negative.   Musculoskeletal: Negative.   Skin: Negative.   Neurological: Negative.   Endo/Heme/Allergies: Negative.   Psychiatric/Behavioral: Positive for depression and substance abuse. The patient is nervous/anxious.     Blood pressure 148/96, pulse 76, temperature 97.4 F (36.3 C), temperature source Oral, resp. rate 18, height 5\' 7"  (1.702 m), weight 117.482 kg (259 lb), SpO2 100 %.Body mass index is 40.56 kg/(m^2).  General Appearance: Fairly Groomed  Patent attorney::  Fair  Speech:  Clear and Coherent  Volume:  fluctuates  Mood:  Anxious, Depressed and Dysphoric  Affect:  Restricted  Thought Process:  Coherent and Goal Directed  Orientation:  Full (Time, Place, and Person)  Thought Content:  symptoms events worries concerns  Suicidal Thoughts:  No  Homicidal Thoughts:  No  Memory:  Immediate;   Fair Recent;   Fair Remote;   Fair  Judgement:  Fair  Insight:  Present  Psychomotor Activity:  Restlessness and worries  Concentration:  Fair  Recall:   [employer] of Knowledge:Fair  Language: Fair  Akathisia:  No  Handed:  Right  AIMS (if indicated):     Assets:  Desire for Improvement  ADL's:  Intact  Cognition: WNL  Sleep:  Number of Hours: 6.25   Treatment Plan Summary: Daily contact with patient to assess and evaluate symptoms and progress in treatment and Medication management Supportive approach/coping skills Opioid dependence; continue to work a relapse prevention plan Depression/compulive behavior ( hair pulling ) will continue the Prozac 30 mg Mood instability; will continue the Lamictal, continue to reassess tolerability. She is willing to give a longer try to see if she develops tolerance to that side effect. Work with CBT/mindfulness Explore residential treatment options.  Armandina Stammer I, NP, PMHNP-BC 04/18/2015, 4:02 PM  Patient seen face to face  for psychiatric evaluation. Chart reviewed and finding discussed with Physician extender. Agreed with disposition and treatment plan.   Kathryne Sharper, MD

## 2015-04-18 NOTE — Progress Notes (Signed)
D:Per patient self inventory form pt reports she slept good last night with the use of sleep medication. She reports a good appetite, normal energy level, good concentration. Pt rates depression 6/10, hopelessness 2/10, anxiety 6/10- all on 0-10 scale, 10 being the worse. Pt denies SI/HI. Denies AVH. Pt reports her goal is "to not worry or stress about things I cant change" Pt reports she will "think about better things" to help meet her goal. Pt noted socializing with peers in the day room. Pt behavior cooperative. Pleasant on approach.  A:special checks q 15 mins in place for safety. Medication administered per MD order (See eMAR) Encourgement and support provided.  R:safety maintained. Compliant with medication regimen. Will continue to monitor.

## 2015-04-19 MED ORDER — CLONIDINE HCL 0.2 MG PO TABS
0.2000 mg | ORAL_TABLET | Freq: Once | ORAL | Status: AC
  Administered 2015-04-19: 0.2 mg via ORAL
  Filled 2015-04-19: qty 1
  Filled 2015-04-19: qty 2

## 2015-04-19 NOTE — [facility] Group Notes (Signed)
[facility] Group Notes:  (Clinical Social Work)  04/19/2015  10:00-11:00AM  Summary of Progress/Problems:   The main focus of today's process group was to   1)  Talk about 4 definitions of support  2)  Discuss how sometimes the support we want from someone is not possible, but that does not mean those people do not support [place] in another way  3)  Define health supports versus unhealthy supports  4)  Discuss the importance of adding healthy supports and deciding how to deal with unhealthy supports  The patient expressed excellent understanding and motivation to create new supports and make decisions about what to do with the unhealthy supports in her life.  She interacted throughout group, asked questions, gave her thoughts on various matters to others.    Type of Therapy:  Process Group with Motivational Interviewing  Participation Level:  Active  Participation Quality:  Attentive and Sharing  Affect:  Blunted and Depressed  Cognitive:  Appropriate and Oriented  Insight:  Engaged  Engagement in Therapy:  Engaged  Modes of Intervention:   Education, Teacher, English as a foreign language, Activity  Laurie Mantle, LCSW 04/19/2015

## 2015-04-19 NOTE — Progress Notes (Signed)
Patient ID: Laurie Holmes, female   DOB: Dec 31, 1985, 30 y.o.   MRN: [id number] Patient ID: Laurie Holmes, female   DOB: 01/13/1986, 30 y.o.   MRN: [id number] [facility] MD Progress Note  04/19/2015 3:50 PM Laurie Holmes  MRN:  [id number]  Subjective:  Laurie Holmes reports, "I feel a lot better today. Getting ready for the admission Screening at the [facility] in the morning. I will need a ride to get there. I do not have my own transportation".  Principal Problem: MDD (major depressive disorder), recurrent severe, without psychosis (HCC)  Diagnosis:   Patient Active Problem List   Diagnosis Date Noted  . Opioid type dependence, continuous (HCC) [F11.20] 04/16/2015  . MDD (major depressive disorder), recurrent severe, without psychosis (HCC) [F33.2] 04/15/2015  . Anxiety [F41.9]    Total Time spent with patient: 15 minutes  Past Psychiatric History: See admission H&P  Past Medical History:  Past Medical History  Diagnosis Date  . Hypertension   . Anxiety   . Depression    History reviewed. No pertinent past surgical history.  Family History: History reviewed. No pertinent family history.  Family Psychiatric  History: See admission H&P  Social History:  History  Alcohol Use  . Yes    Comment: rare     History  Drug Use  . Yes    Social History   Social History  . Marital Status: Single    Spouse Name: N/A  . Number of Children: N/A  . Years of Education: N/A   Social History Main Topics  . Smoking status: Current Every Day Smoker    Types: Cigarettes  . Smokeless tobacco: Never Used  . Alcohol Use: Yes     Comment: rare  . Drug Use: Yes  . Sexual Activity: Not Asked     Comment: Rx drugs   Other Topics Concern  . None   Social History Narrative   Additional Social History:   Sleep: Fair  Appetite:  Fair  Current Medications: Current Facility-Administered Medications  Medication Dose Route Frequency Provider Last Rate Last Dose  .  acetaminophen (TYLENOL) tablet 650 mg  650 mg Oral Q6H PRN Beau Fanny, FNP   650 mg at 04/18/15 1254  . alum & mag hydroxide-simeth (MAALOX/MYLANTA) 200-200-20 MG/5ML suspension 30 mL  30 mL Oral Q4H PRN Beau Fanny, FNP      . FLUoxetine (PROZAC) capsule 30 mg  30 mg Oral Daily Rachael Fee, MD   30 mg at 04/19/15 0754  . hydrOXYzine (ATARAX/VISTARIL) tablet 25 mg  25 mg Oral Q6H PRN Adonis Brook, NP   25 mg at 04/19/15 1353  . hydrOXYzine (ATARAX/VISTARIL) tablet 50 mg  50 mg Oral QHS,MR X 1 Kerry Hough, PA-C   50 mg at 04/18/15 2304  . lamoTRIgine (LAMICTAL) tablet 25 mg  25 mg Oral Daily Rachael Fee, MD   25 mg at 04/19/15 0755  . lisinopril (PRINIVIL,ZESTRIL) tablet 10 mg  10 mg Oral Daily Sanjuana Kava, NP   10 mg at 04/19/15 0754  . loperamide (IMODIUM) capsule 2 mg  2 mg Oral PRN Rachael Fee, MD      . magnesium hydroxide (MILK OF MAGNESIA) suspension 30 mL  30 mL Oral Daily PRN Beau Fanny, FNP      . mirtazapine (REMERON) tablet 7.5 mg  7.5 mg Oral QHS Rachael Fee, MD   7.5 mg at 04/18/15 2159  . nicotine (NICODERM CQ - dosed  in mg/24 hours) patch 21 mg  21 mg Transdermal Daily Rachael Fee, MD   21 mg at 04/19/15 0801  . ondansetron (ZOFRAN-ODT) disintegrating tablet 4 mg  4 mg Oral Q8H PRN Rachael Fee, MD       Lab Results: No results found for this or any previous visit (from the past 48 hour(s)).  Physical Findings: AIMS: Facial and Oral Movements Muscles of Facial Expression: None, normal Lips and Perioral Area: None, normal Jaw: None, normal Tongue: None, normal,Extremity Movements Upper (arms, wrists, hands, fingers): None, normal Lower (legs, knees, ankles, toes): None, normal, Trunk Movements Neck, shoulders, hips: None, normal, Overall Severity Severity of abnormal movements (highest score from questions above): None, normal Incapacitation due to abnormal movements: None, normal Patient's awareness of abnormal movements (rate only patient's  report): No Awareness, Dental Status Current problems with teeth and/or dentures?: No Does patient usually wear dentures?: No  CIWA:  CIWA-Ar Total: 0 COWS:  COWS Total Score: 1  Musculoskeletal: Strength & Muscle Tone: within normal limits Gait & Station: normal Patient leans: normal  Psychiatric Specialty Exam: Review of Systems  Constitutional: Positive for malaise/fatigue.  HENT: Negative.   Eyes: Negative.   Respiratory: Negative.   Cardiovascular: Negative.   Gastrointestinal: Negative.   Genitourinary: Negative.   Musculoskeletal: Negative.   Skin: Negative.   Neurological: Negative.   Endo/Heme/Allergies: Negative.   Psychiatric/Behavioral: Positive for depression and substance abuse. The patient is nervous/anxious.     Blood pressure 151/114, pulse 83, temperature 98.4 F (36.9 C), temperature source Oral, resp. rate 20, height  (1.702 m), weight 117.482 kg (259 lb), SpO2 100 %.Body mass index is 40.56 kg/(m^2).  General Appearance: Fairly Groomed  Patent attorney::  Fair  Speech:  Clear and Coherent  Volume:  fluctuates  Mood:  Anxious, Depressed and Dysphoric  Affect:  Restricted  Thought Process:  Coherent and Goal Directed  Orientation:  Full (Time, Place, and Person)  Thought Content:  symptoms events worries concerns  Suicidal Thoughts:  No  Homicidal Thoughts:  No  Memory:  Immediate;   Fair Recent;   Fair Remote;   Fair  Judgement:  Fair  Insight:  Present  Psychomotor Activity:  Restlessness and worries  Concentration:  Fair  Recall:  [employer] of Knowledge:Fair  Language: Fair  Akathisia:  No  Handed:  Right  AIMS (if indicated):     Assets:  Desire for Improvement  ADL's:  Intact  Cognition: WNL  Sleep:  Number of Hours: 5.25   Treatment Plan Summary: Daily contact with patient to assess and evaluate symptoms and progress in treatment and Medication management Supportive approach/coping skills Opioid dependence; continue to work a  relapse prevention plan, has screening at the [facility] tomorrow 04-20-15. Depression/compulive behavior ( hair pulling ) will continue the Prozac 30 mg, denies any adverse effects Mood instability; will continue the Lamictal, continue to reassess tolerability. She is willing to give a longer try to see if she develops tolerance to that side effect. Work with CBT/mindfulness Explore residential treatment options; [facility] residential screening date tomorrow 04-20-15. Will need a bus bas to get the [facility] in am.  Sanjuana Kava, NP, PMHNP-BC 04/19/2015, 3:50 PM

## 2015-04-19 NOTE — Progress Notes (Signed)
Nursing Shift Assessment  Patient is pleasant, cooperative, denies any SI/HI/AVH or pain but does express anxiety concerning how she will be transported to [facility] when she is discharged she believes on Monday. Nurse encouraging her to discuss this issue with the Provider and also with her Child psychotherapist. Patient denying any SI/HI/AVH, and on Self Inventory Sheet lists depression at zero, anxiety at 2 (related to transport at discharge), and states she got a good night's sleep. Patient stating to Nurse she is ready to continue on the road to recovery and become productive again. Her stated goals for the day are to continue "getting ready for discharge" and get the transportation issue resolved.

## 2015-04-19 NOTE — Plan of Care (Signed)
Problem: Consults Goal: Depression Patient Education See Patient Education Module for education specifics.  Outcome: Progressing Nurse discussed depression/coping skills with patient.        

## 2015-04-19 NOTE — [facility] Group Notes (Signed)
[facility] Group Notes:  Healthy coping skills  Date:  04/19/2015  Time:  1300  Type of Therapy:  Nurse Education  Participation Level:  Did attend  Participation Quality:  Appropriate  Affect:  Appropriate  Cognitive:  Appropriate  Insight:  Appropriate  Engagement in Group:  Engaged  Modes of Intervention:  Clarification and Discussion  Summary of Progress/Problems:Pt stated her support system are her two best friends and sisters who do not engage in drug usage. Pt stated she enjoys writing and reading books. She loves to travel to new places and likes to meet new people. Pt stated the drugs get in the way of her doing these things.   Rodman Key [facility] 04/19/2015, 2:18 PM

## 2015-04-19 NOTE — Progress Notes (Signed)
Patient with elevated blood pressure this am. 162/120 with Dinamap and 150/110 manually. Patient with no complaints of dizziness, headache, or any other symptoms she is asymptomatic at this time. Drenda Freeze NP notified and made aware. Patient scheduled to receive lisinopril 10 mg at 0800 am. No new orders receive. Will continue to monitor patient.

## 2015-04-19 NOTE — Progress Notes (Signed)
D: Patient observed in day room interacting with peers. Patient in pleasant mood and rates her day a "10." Patient states her goal for the day was to " not worry and stress so much."  Patient feels she is getting better. A: Support and encouragement offered. Q 15 minute checks in progress and maintained.  R: Patient remains safe on unit and monitoring continues.

## 2015-04-20 MED ORDER — FLUOXETINE HCL 10 MG PO CAPS: 30.0000 mg | ORAL_CAPSULE | Freq: Every day | ORAL | Status: AC

## 2015-04-20 MED ORDER — NICOTINE 21 MG/24HR TD PT24: 21.0000 mg | MEDICATED_PATCH | Freq: Every day | TRANSDERMAL | Status: AC

## 2015-04-20 MED ORDER — CLONIDINE HCL 0.1 MG PO TABS
0.1000 mg | ORAL_TABLET | Freq: Two times a day (BID) | ORAL | Status: DC
  Administered 2015-04-20: 0.1 mg via ORAL
  Filled 2015-04-20 (×3): qty 1

## 2015-04-20 MED ORDER — CLONIDINE HCL 0.1 MG PO TABS
0.1000 mg | ORAL_TABLET | Freq: Two times a day (BID) | ORAL | Status: DC
  Filled 2015-04-20: qty 28
  Filled 2015-04-20: qty 1
  Filled 2015-04-20: qty 28
  Filled 2015-04-20: qty 1

## 2015-04-20 MED ORDER — MIRTAZAPINE 7.5 MG PO TABS: 7.5000 mg | ORAL_TABLET | Freq: Every day | ORAL | Status: AC

## 2015-04-20 MED ORDER — LAMOTRIGINE 25 MG PO TABS: 25.0000 mg | ORAL_TABLET | Freq: Every day | ORAL | Status: AC

## 2015-04-20 MED ORDER — CLONIDINE HCL 0.1 MG PO TABS: 0.1000 mg | ORAL_TABLET | Freq: Two times a day (BID) | ORAL | Status: AC

## 2015-04-20 MED ORDER — LISINOPRIL 10 MG PO TABS: 10.0000 mg | ORAL_TABLET | Freq: Every day | ORAL | Status: AC

## 2015-04-20 MED ORDER — HYDROXYZINE HCL 50 MG PO TABS
50.0000 mg | ORAL_TABLET | Freq: Three times a day (TID) | ORAL | Status: DC | PRN
  Filled 2015-04-20: qty 10

## 2015-04-20 MED ORDER — HYDROXYZINE HCL 50 MG PO TABS: 50.0000 mg | ORAL_TABLET | Freq: Three times a day (TID) | ORAL | Status: AC | PRN

## 2015-04-20 NOTE — Progress Notes (Signed)
Pt discharged home with her friend. Pt was ambulatory, stable and appreciative at that time. All papers and prescriptions were given and valuables returned. Verbal understanding expressed. Denies SI/HI and A/VH. Pt given opportunity to express concerns and ask questions.
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
## 2015-04-20 NOTE — Progress Notes (Signed)
Pt has been observed sitting in the dayroom watching the football game, with appropriate interaction with her peers.  She came to the med window with the c/o a headache.  Her BP was elevated in the AM, so her BP was checked this evening and found to still be elevated.  Writer spoke with the NP who ordered a one time dose of Clonidine 0.2 mg.  Pt was also given Tylenol for her headache.  Will continue to monitor pt's BP.  Pt reported before going to bed that her headache was decreasing.  Pt denies SI/HI/AVH.  She reports that she will be going to [facility].  There is no order for pt to leave early in the morning for an assessment.  She does not have personal transportation, nor any samples for the facility.  Will inform the first shift nurse.  Support and encouragement offered.  Pt makes her needs known to staff.  Safety maintained with q15 minute checks.

## 2015-04-20 NOTE — Progress Notes (Signed)
  [facility] Adult Case Management Discharge Plan :  Will you be returning to the same living situation after discharge:  Yes,  home until [facility] Screening scheduled for 04/21/15 At discharge, do you have transportation home?: Yes,  friend Do you have the ability to pay for your medications: Yes,  mental health  Release of information consent forms completed and submitted to medical records by CSW.  Patient to Follow up at: Follow-up Information    Follow up with [facility].   Why:  Walk in between 8am-9am Monday through Friday for hospital follow-up/medication management/assessment for therapy services.    Contact information:   [address], [place] [address] Phone: [phone] Fax: [phone]      Follow up with [facility]  On 04/21/2015.   Why:  Screening for possible admission on this date at 8:00AM. Please [place] proof [place] to this appt. Thank you.    Contact information:   [address]  [place], [place] [address] Phone: [phone] Fax: [phone]      Next level of care provider has access to [facility] Link:no  Safety Planning and Suicide Prevention discussed: Yes,  SPE completed with pt, as she refused to consent to family contact.   Have you used any form of tobacco in the last 30 days? (Cigarettes, Smokeless Tobacco, Cigars, and/or Pipes): Yes  Has patient been referred to the Quitline?: Patient refused referral  Patient has been referred for addiction treatment: Yes  Smart, Plez Belton LCSW 04/20/2015, 10:10 AM

## 2015-04-20 NOTE — Progress Notes (Signed)
Patient did attend the evening speaker AA meeting.  

## 2015-04-20 NOTE — Tx Team (Signed)
Interdisciplinary Treatment Plan Update (Adult)  Date:  04/20/2015  Time Reviewed:  10:10 AM   Progress in Treatment: Attending groups: Yes Participating in groups:  Yes Taking medication as prescribed:  Yes. Tolerating medication:  Yes. Family/Significant othe contact made:  SPE completed with pt, as she refused to consent to family contact.  Patient understands diagnosis:  Yes. and As evidenced by:  seeking treatment for: depression, alcohol abuse/pain pill abuse, medication stabilization and passive SI.  Discussing patient identified problems/goals with staff:  Yes. Medical problems stabilized or resolved:  Yes. Denies suicidal/homicidal ideation: Yes. Issues/concerns per patient self-inventory:  Other:  Discharge Plan or Barriers: Pt has [facility] Screening on 04/21/15 and plans to follow-up with [facility] on an outpatient basis. Pt's friend or sister will pick her up at discharge today.   Reason for Continuation of Hospitalization: None  Comments:  Laurie Holmes is a single, 30 y.o. female reporting worsening depression, anxiety, and substance abuse. She recently moved to the [place] area 6-7 months ago from [place] to be closer to family. However, the pt reports that she doesn't feel like her family understands her depression or her struggle with drug addiction. She says that her sisters have said things like "just snap out of it" and frequently treat her like she is a "crack head" or a "junkie" because she abuses narcotic pain killers. She reports having very little social or familial support and says that she's been feeling suicidal for the past 3-4 weeks. The pt also reports being "scared to be alone" and experiencing thoughts of wanting to harm herself for the past few weeks. Pt states, "I cry myself to sleep every night and think about wanting to die". She denies any intent to act on these thoughts but is still fearful that something could happen unintentionally  due to her impulsivity and drug use. Pt reports that her depression has gotten so bad, she even quit her job because she lacked the motivation to get out of bed in the morning and go to work.  Pt reports feeling physically and emotionally "exhausted". She has experienced several major stressors within the last year, including moving away from her support system in [place], quitting her job, and starting over with a new mental health provider. She has one prior suicide attempt "many years ago" via cutting her wrists. She has 1 prior psychiatric hospitalization in [place] as well, at one of their inpt facilities. There is a family hx of mental illness and SA, especially etoh addiction. The pt denies HI, A/VH, current self-harm, violent behaviors, legal issues, and panic attacks. She does endorse anxiety but says it is a more generalized anxiety, involving trouble sleeping, excessive worrying, and rumination over wrong-doings from her past. Pt indicates that she currently uses opioids on a daily basis and etoh on a less regular basis; she uses about ten 10 mg Percocet pills per day and drinks 1-2 glasses of wine or beer a few times per week.  Pt reiterates that her drug of choice is opioid pain pills, and she denies ever progressing to heroin or any "harder" opiates. She also denies any current withdrawal sx but admits that she does get "sick" if she goes too long without pills. The pt is currently receiving med management from [facility]; she reports currently taking her psychiatric meds as prescribed. The pt believes that she needs some type of "long term" treatment that will address both her mental health and substance abuse needs, as she feels that she  is using drugs and etoh in an attempt to self-medicate her depression and anxiety.  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 Patient verbalized understanding and agreed to treatment plan. CSW reviewed [facility] "Discharge Process and Patient Involvement" Form. Pt verbalized understanding of information provided and signed form.    Review of initial/current patient goals per problem list:  1. Goal(s): Patient will participate in aftercare plan  Met: Yes  Target date: at discharge  As evidenced by: Patient will participate within aftercare plan AEB aftercare provider and housing plan at discharge being identified.  2/2: CSW assessing for appropriate referrals.   2/6: Pt has screening for possible admission at [facility] on Tuesday, 04/21/15.   2. Goal (s): Patient will exhibit decreased depressive symptoms and suicidal ideations.  Met: Yes   Target date: at discharge  As evidenced by: Patient will utilize self rating of depression at 3 or below and demonstrate decreased signs of depression or be deemed stable for discharge by MD.  2/2: Pt rates depression as high today. Denies SI/HI/AVH.   2/6: Pt rates depression as 1/10 and presents with pleasant mood/calm affect.   3. Goal(s): Patient will demonstrate decreased signs of withdrawal due to substance abuse  Met:Yes  Target date:at discharge   As evidenced by: Patient will produce a CIWA/COWS score of 0, have stable vitals signs, and no symptoms of withdrawal.  2/2: Pt reports minimal signs of withdrawal with CIWA score of 0/COWS of 1 and high BP.   Attendees: Patient:   04/20/2015 10:10 AM   Family:   04/20/2015 10:10 AM   Physician:  Dr. Carlton Adam, MD 04/20/2015 10:10 AM   Nursing:   Chestine Spore RN 04/20/2015 10:10 AM   Clinical Social Worker: Maxie Better, LCSW 04/20/2015 10:10 AM   Clinical Social Worker: Peri Maris LCSWA 04/20/2015 10:10 AM   Other:  Gerline Legacy Nurse Case Manager 04/20/2015 10:10 AM   Other: 04/20/2015  10:10 AM   Other:   04/20/2015 10:10 AM   Other:  04/20/2015 10:10 AM   Other:  04/20/2015 10:10 AM   Other:  04/20/2015 10:10 AM    04/20/2015 10:10 AM    04/20/2015 10:10 AM    04/20/2015 10:10 AM    04/20/2015 10:10 AM    Scribe for Treatment Team:   Maxie Better, LCSW 04/20/2015 10:10 AM

## 2015-04-20 NOTE — BHH Suicide Risk Assessment (Signed)
[facility] Discharge Suicide Risk Assessment   Principal Problem: MDD (major depressive disorder), recurrent severe, without psychosis (HCC) Discharge Diagnoses:  Patient Active Problem List   Diagnosis Date Noted  . Opioid type dependence, continuous (HCC) [F11.20] 04/16/2015  . MDD (major depressive disorder), recurrent severe, without psychosis (HCC) [F33.2] 04/15/2015  . Anxiety [F41.9]     Total Time spent with patient: 20 minutes  Musculoskeletal: Strength & Muscle Tone: within normal limits Gait & Station: normal Patient leans: normal  Psychiatric Specialty Exam: Review of Systems  Constitutional: Negative.   HENT: Negative.   Eyes: Negative.   Respiratory: Negative.   Cardiovascular: Negative.   Gastrointestinal: Negative.   Genitourinary: Negative.   Musculoskeletal: Negative.   Skin: Negative.   Neurological: Negative.   Endo/Heme/Allergies: Negative.   Psychiatric/Behavioral: Positive for depression and substance abuse.    Blood pressure 176/112, pulse 81, temperature 98 F (36.7 C), temperature source Oral, resp. rate 20, height  (1.702 m), weight 117.482 kg (259 lb), SpO2 100 %.Body mass index is 40.56 kg/(m^2).  General Appearance: Fairly Groomed  Patent attorney::  Fair  Speech:  Clear and Coherent409  Volume:  Normal  Mood:  Euthymic  Affect:  Appropriate  Thought Process:  Coherent and Goal Directed  Orientation:  Full (Time, Place, and Person)  Thought Content:  plans as she moves on, relapse prevention plan  Suicidal Thoughts:  No  Homicidal Thoughts:  No  Memory:  Immediate;   Fair Recent;   Fair Remote;   Fair  Judgement:  Fair  Insight:  Present  Psychomotor Activity:  Normal  Concentration:  Fair  Recall:  [employer] of Knowledge:Fair  Language: Fair  Akathisia:  No  Handed:  Right  AIMS (if indicated):     Assets:  Desire for Improvement  Sleep:  Number of Hours: 6.25  Cognition: WNL  ADL's:  Intact  In full contact with reality.  Overall feels better. States she wants to be D/C today. Will stay with a friend and she is scheduled to go to [facility] in the morning. She states she is committed to make this work for her. States she has plans in order to get her life back on track and she does not want anything to get in the way. She is looking forward to go to [facility] in the morning Mental Status Per Nursing Assessment::   On Admission:     Demographic Factors:  NA  Loss Factors: Decrease in vocational status and Financial problems/change in socioeconomic status  Historical Factors: NA  Risk Reduction Factors:   Sense of responsibility to family  Continued Clinical Symptoms:  Depression:   Comorbid alcohol abuse/dependence Alcohol/Substance Abuse/Dependencies  Cognitive Features That Contribute To Risk:  None    Suicide Risk:  Minimal: No identifiable suicidal ideation.  Patients presenting with no risk factors but with morbid ruminations; may be classified as minimal risk based on the severity of the depressive symptoms  Follow-up Information    Follow up with [facility].   Why:  Walk in between 8am-9am Monday through Friday for hospital follow-up/medication management/assessment for therapy services.    Contact information:   [address], [place] [address] Phone: [phone] Fax: [phone]      Follow up with [facility]  On 04/21/2015.   Why:  Screening for possible admission on this date at 8:00AM. Please [place] proof of [place] to this appt. Thank you.    Contact information:   [address]  [place], [place] [address] Phone: [phone] Fax: [phone]      Plan Of Care/Follow-up recommendations:  Activity:  as tolerated Diet:  regular Follow up as above Resa Rinks A, MD 04/20/2015, 12:42 PM

## 2015-04-20 NOTE — Progress Notes (Signed)
Recreation Therapy Notes  Date: 02.06.2017 Time: 9:30am Location: [address] Group Room   Group Topic: Stress Management  Goal Area(s) Addresses:  Patient will actively participate in stress management techniques presented during session.   Behavioral Response: Did not attend.   Orman Matsumura L Khiley Lieser, LRT/CTRS        Terrie Grajales L 04/20/2015 3:27 PM 

## 2015-05-30 ENCOUNTER — Emergency Department (HOSPITAL_BASED_OUTPATIENT_CLINIC_OR_DEPARTMENT_OTHER)
Admission: EM | Admit: 2015-05-30 | Discharge: 2015-05-30 | Disposition: A | Discharge status: Home / Self Care | Payer: Self-pay | Attending: Emergency Medicine | Admitting: Emergency Medicine

## 2015-05-30 ENCOUNTER — Encounter (HOSPITAL_BASED_OUTPATIENT_CLINIC_OR_DEPARTMENT_OTHER): Payer: Self-pay | Admitting: *Deleted

## 2015-05-30 DIAGNOSIS — I10 Essential (primary) hypertension: Secondary | ICD-10-CM | POA: Insufficient documentation

## 2015-05-30 DIAGNOSIS — F17210 Nicotine dependence, cigarettes, uncomplicated: Secondary | ICD-10-CM | POA: Insufficient documentation

## 2015-05-30 DIAGNOSIS — F329 Major depressive disorder, single episode, unspecified: Secondary | ICD-10-CM | POA: Insufficient documentation

## 2015-05-30 DIAGNOSIS — F419 Anxiety disorder, unspecified: Secondary | ICD-10-CM | POA: Insufficient documentation

## 2015-05-30 DIAGNOSIS — Z79899 Other long term (current) drug therapy: Secondary | ICD-10-CM | POA: Insufficient documentation

## 2015-05-30 MED ORDER — IBUPROFEN 400 MG PO TABS
400.0000 mg | ORAL_TABLET | Freq: Once | ORAL | Status: AC
  Administered 2015-05-30: 400 mg via ORAL
  Filled 2015-05-30: qty 1

## 2015-05-30 NOTE — Discharge Instructions (Signed)
Return to the ED with any concerns including chest pain, difficulty breathing, changes in vision or speech, weakness of arms or legs, fainting, decreased level of alertness/lethargy, or any other alarming symptoms °

## 2015-05-30 NOTE — ED Notes (Signed)
Pt reports HTN and HA that started today.  Pt is a pt at [facility] for [facility] rehab.

## 2015-05-30 NOTE — ED Provider Notes (Signed)
CSN: [id number]     Arrival date & time 05/30/15  1150 History   First MD Initiated Contact with Patient 05/30/15 1313     Chief Complaint  Patient presents with  . Hypertension     (Consider location/radiation/quality/duration/timing/severity/associated sxs/prior Treatment) HPI  Pt presents from [facility] due to concern about her blood pressure.  She states she had a headache this morning so her BP was checked at [facility]- they then referred her to the ED due to elevated reading.  She was given her morning lisinopril dose and clonidine dose.  She states that she currently no longer has a headache.  No chest pain, no shortness of breath.  No focal weakness.  No changes in vision or speech.  There are no other associated systemic symptoms, there are no other alleviating or modifying factors.   Past Medical History  Diagnosis Date  . Hypertension   . Anxiety   . Depression    History reviewed. No pertinent past surgical history. History reviewed. No pertinent family history. Social History  Substance Use Topics  . Smoking status: Current Every Day Smoker    Types: Cigarettes  . Smokeless tobacco: Never Used  . Alcohol Use: Yes     Comment: rare   OB History    No data available     Review of Systems  ROS reviewed and all otherwise negative except for mentioned in HPI    Allergies  Review of patient's allergies indicates no known allergies.  Home Medications   Prior to Admission medications   Medication Sig Start Date End Date Taking? Authorizing Provider  cloNIDine (CATAPRES) 0.1 MG tablet Take 1 tablet (0.1 mg total) by mouth 2 (two) times daily. 04/20/15   Oneta Rack, NP  FLUoxetine (PROZAC) 10 MG capsule Take 3 capsules (30 mg total) by mouth daily. 04/20/15   Oneta Rack, NP  hydrOXYzine (ATARAX/VISTARIL) 50 MG tablet Take 1 tablet (50 mg total) by mouth every 8 (eight) hours as needed. 04/20/15   Oneta Rack, NP  lamoTRIgine (LAMICTAL) 25 MG tablet Take 1 tablet  (25 mg total) by mouth daily. 04/20/15   Oneta Rack, NP  lisinopril (PRINIVIL,ZESTRIL) 10 MG tablet Take 1 tablet (10 mg total) by mouth daily. 04/20/15   Oneta Rack, NP  mirtazapine (REMERON) 7.5 MG tablet Take 1 tablet (7.5 mg total) by mouth at bedtime. 04/20/15   Oneta Rack, NP  nicotine (NICODERM CQ - DOSED IN MG/24 HOURS) 21 mg/24hr patch Place 1 patch (21 mg total) onto the skin daily. 04/20/15   Oneta Rack, NP   BP 159/110 mmHg  Pulse 63  Temp(Src) 98.3 F (36.8 C) (Oral)  Resp 18  Ht  (1.702 m)  Wt 266 lb (120.657 kg)  BMI 41.65 kg/m2  SpO2 100%  LMP 05/09/2015  Vitals reviewed Physical Exam  Physical Examination: General appearance - alert, well appearing, and in no distress Mental status - alert, oriented to person, place, and time Eyes - pupils equal and reactive, extraocular eye movements intact Mouth - mucous membranes moist, pharynx normal without lesions Chest - clear to auscultation, no wheezes, rales or rhonchi, symmetric air entry Heart - normal rate, regular rhythm, normal S1, S2, no murmurs, rubs, clicks or gallops Abdomen - soft, nontender, nondistended, no masses or organomegaly Neurological - alert, oriented, normal speech Extremities - peripheral pulses normal, no pedal edema, no clubbing or cyanosis Skin - normal coloration and turgor, no rashes  ED Course  Procedures (including critical care time) Labs Review Labs Reviewed - No data to display  Imaging Review No results found. I have personally reviewed and evaluated these images and lab results as part of my medical decision-making.   EKG Interpretation None      MDM   Final diagnoses:  Essential hypertension    Pt presenting with c/o elevated blood pressure.  She had headache earlier today but none now.  Pt is asymptomatic.  She is on BP meds.  There is no sign of end organ damage and no further workup is necessary at this time.  Discharged with strict return precautions.  Pt  agreeable with plan.    Jerelyn ScottMartha Linker, MD 05/30/15 [phone]

## 2015-09-22 IMAGING — US US ABDOMEN COMPLETE
1 series · 14 of 25 positions shown · non-contrast
Comparison: None.

CLINICAL DATA: Acute onset of generalized abdominal pain for 1 day.
Initial encounter.

EXAM:
ULTRASOUND ABDOMEN COMPLETE

[Series 1: us abdomen complete · 0.24mm/px · 14 of 81 slices shown]
[im 1/81]
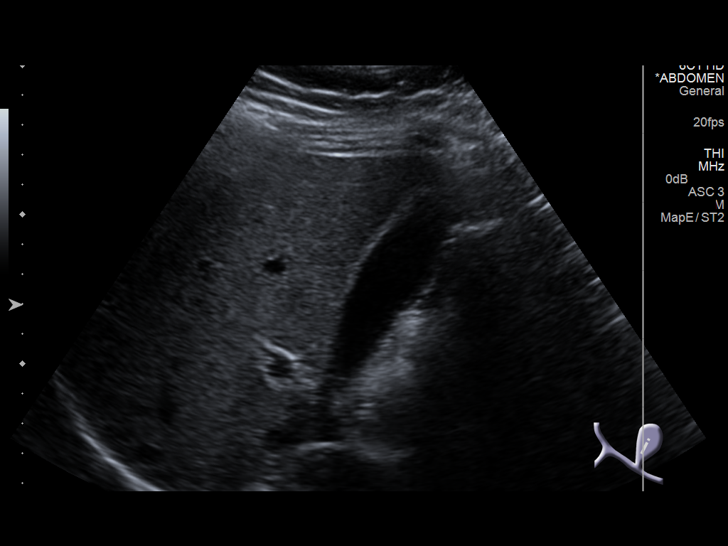
[im 7/81]
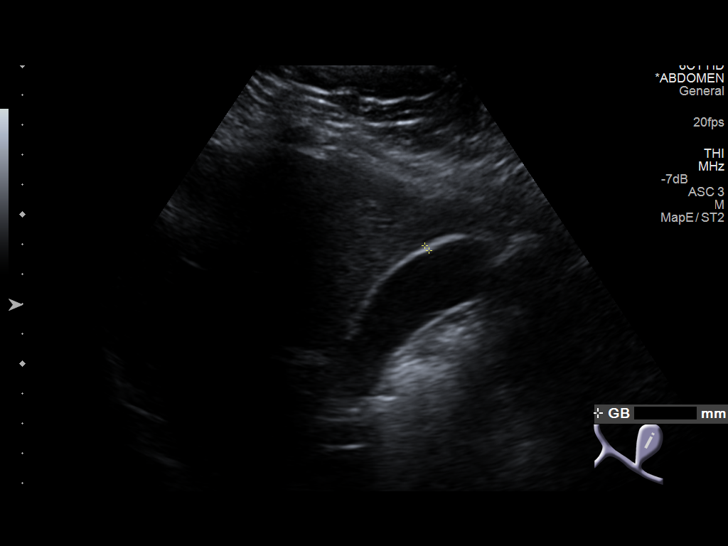
[im 14/81]
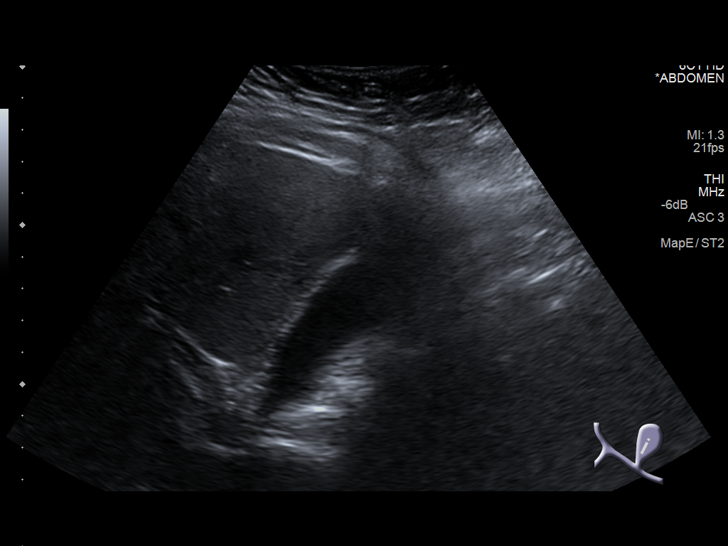
[im 21/81]
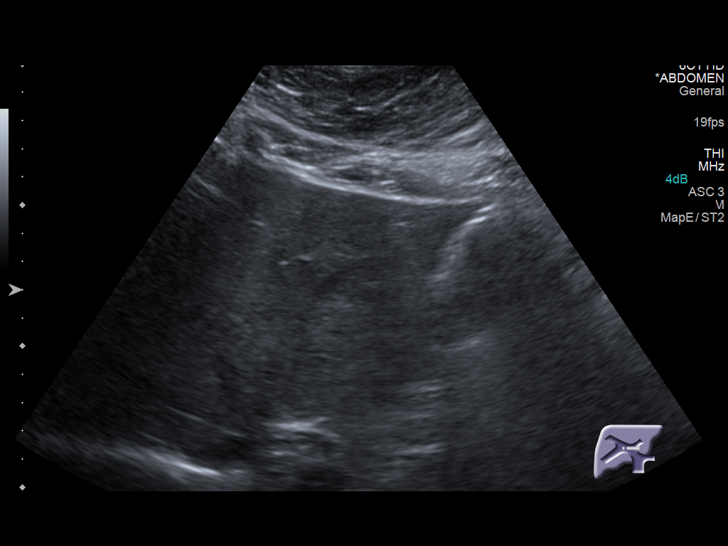
[im 27/81]
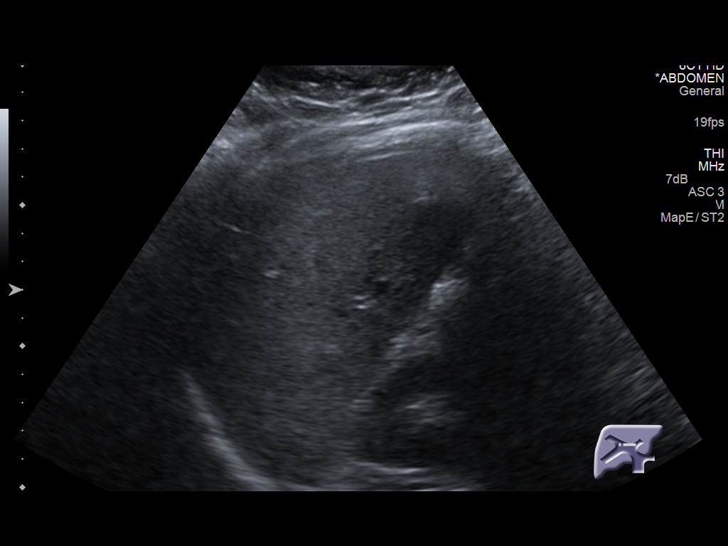
[im 31/81]
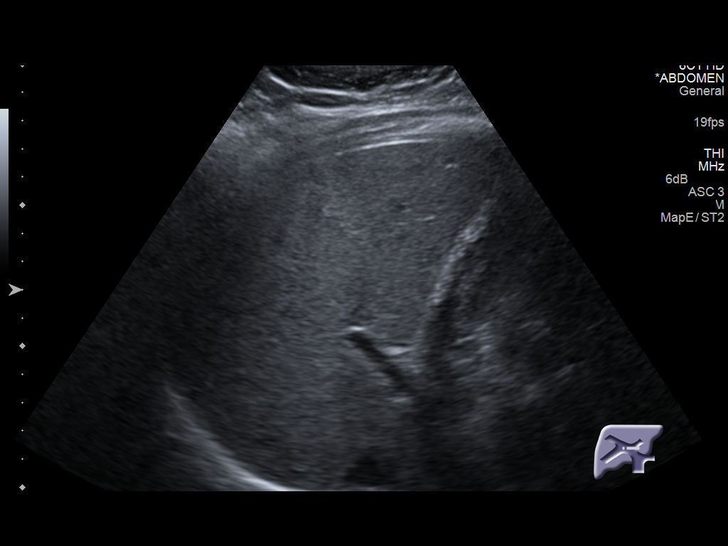
[im 37/81]
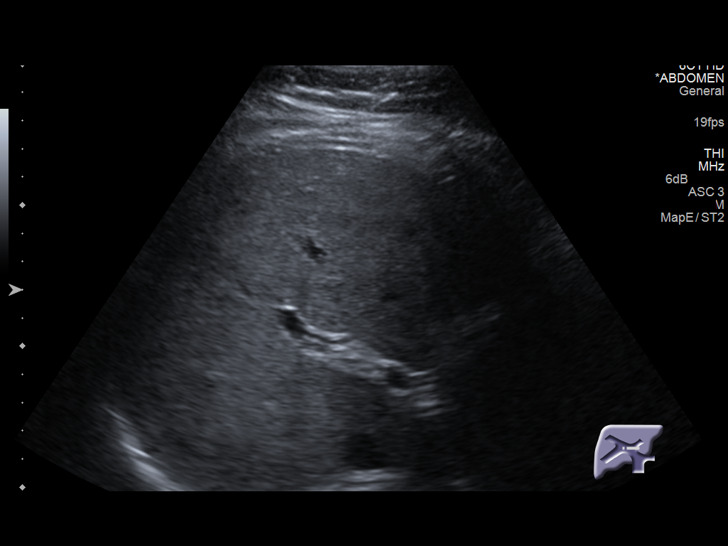
[im 44/81]
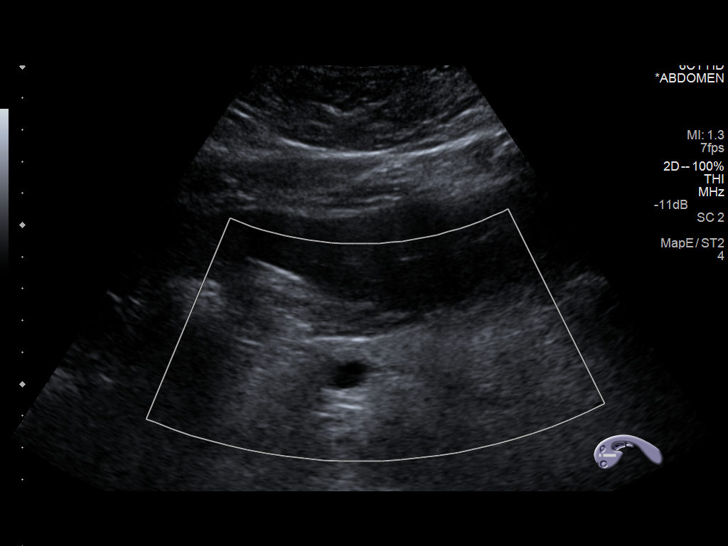
[im 51/81]
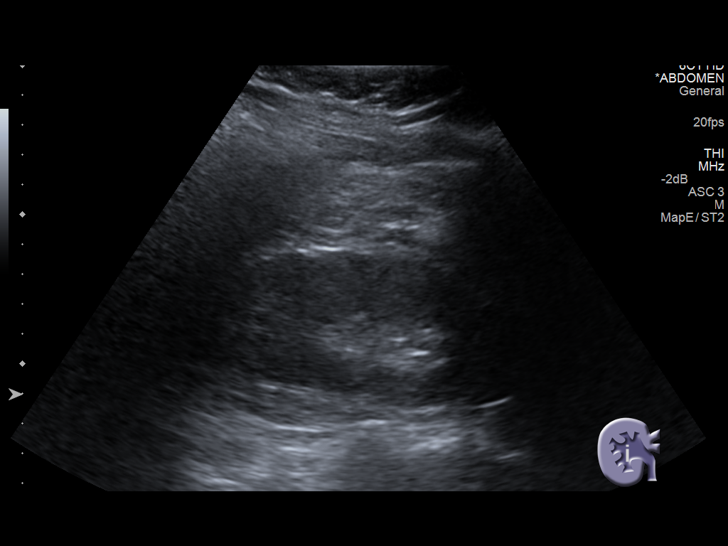
[im 54/81]
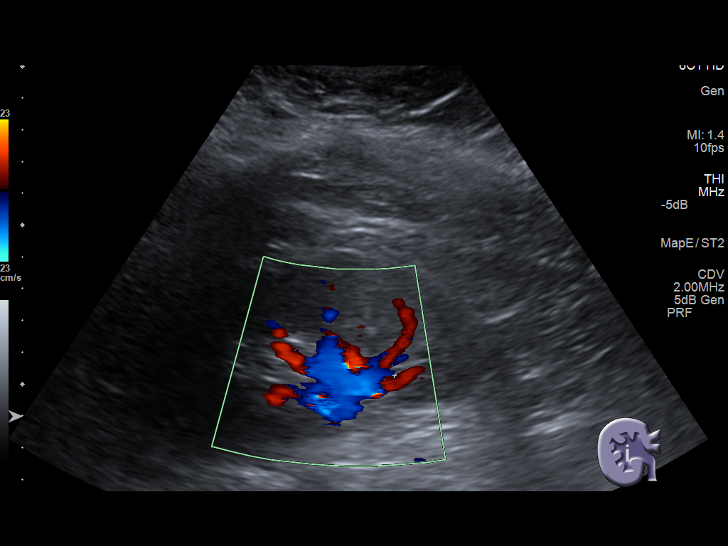
[im 61/81]
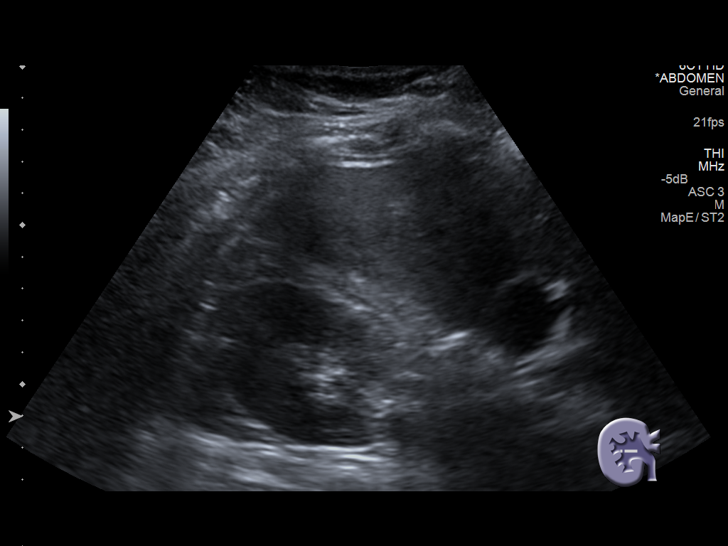
[im 67/81]
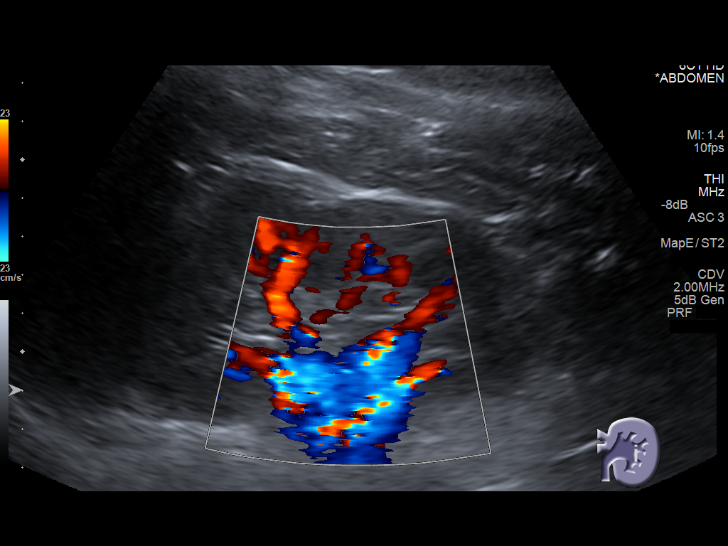
[im 74/81]
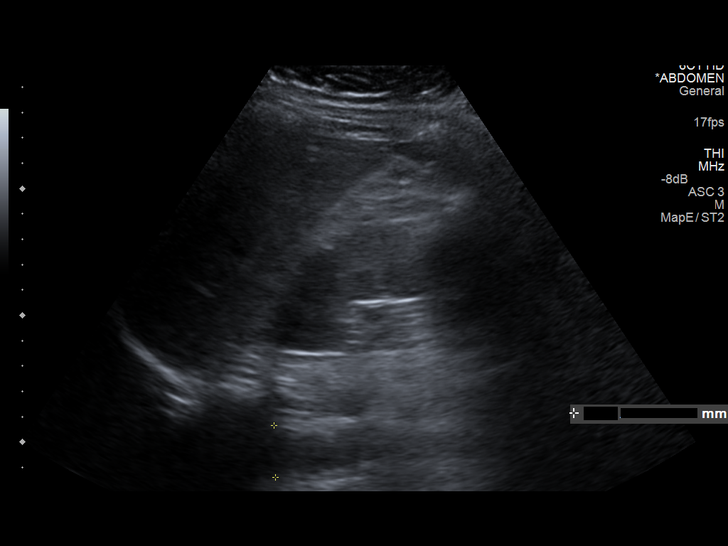
[im 81/81]
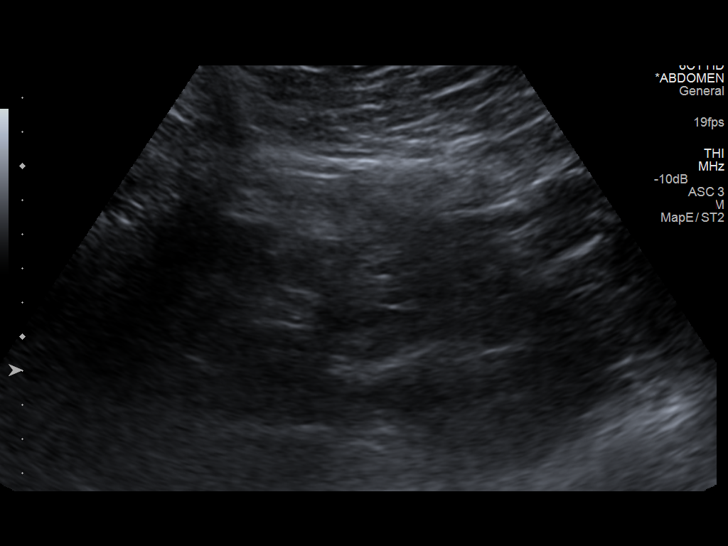

[14 of 25 positions shown; findings below may reference images not displayed]

FINDINGS: Gallbladder: No gallstones or wall thickening visualized. No
sonographic Murphy sign noted.

Common bile duct: Diameter: 0.3 cm, within normal limits in caliber.

Liver: No focal lesion identified. Within normal limits in
parenchymal echogenicity.

IVC: No abnormality visualized.

Pancreas: Visualized portion unremarkable.

Spleen: Size and appearance within normal limits.

Right Kidney: Length: 12.2 cm. Echogenicity within normal limits. No
mass or hydronephrosis visualized.

Left Kidney: Length: 12.0 cm. Echogenicity within normal limits. A
mild fetal lobulation is noted. No mass or hydronephrosis
visualized.

Abdominal aorta: No aneurysm visualized.

Other findings: None.
IMPRESSION: Unremarkable abdominal ultrasound.
